# Patient Record
Sex: Female | Born: 1970 | Race: Black or African American | Hispanic: No | Marital: Single | Smoking: Never smoker
Health system: Southern US, Community
[De-identification: ages and names within clinical notes are randomized; demographics above are authoritative.]

## PROBLEM LIST (undated history)

## (undated) DIAGNOSIS — D649 Anemia, unspecified: Secondary | ICD-10-CM

## (undated) DIAGNOSIS — B20 Human immunodeficiency virus [HIV] disease: Secondary | ICD-10-CM

## (undated) DIAGNOSIS — Z21 Asymptomatic human immunodeficiency virus [HIV] infection status: Secondary | ICD-10-CM

## (undated) DIAGNOSIS — Z5189 Encounter for other specified aftercare: Secondary | ICD-10-CM

## (undated) DIAGNOSIS — IMO0001 Reserved for inherently not codable concepts without codable children: Secondary | ICD-10-CM

## (undated) HISTORY — DX: Encounter for other specified aftercare: Z51.89

## (undated) HISTORY — DX: Human immunodeficiency virus (HIV) disease: B20

## (undated) HISTORY — DX: Anemia, unspecified: D64.9

## (undated) HISTORY — DX: Asymptomatic human immunodeficiency virus (hiv) infection status: Z21

## (undated) HISTORY — PX: HERNIA REPAIR: SHX51

## (undated) HISTORY — DX: Reserved for inherently not codable concepts without codable children: IMO0001

---

## 2001-03-06 ENCOUNTER — Other Ambulatory Visit: Admission: RE | Admit: 2001-03-06 | Discharge: 2001-03-06 | Payer: Self-pay | Admitting: Gynecology

## 2001-07-06 ENCOUNTER — Encounter: Payer: Self-pay | Admitting: Infectious Disease

## 2001-07-06 ENCOUNTER — Ambulatory Visit (HOSPITAL_COMMUNITY): Admission: RE | Admit: 2001-07-06 | Discharge: 2001-07-06 | Payer: Self-pay | Admitting: Infectious Diseases

## 2001-07-06 ENCOUNTER — Encounter (INDEPENDENT_AMBULATORY_CARE_PROVIDER_SITE_OTHER): Payer: Self-pay | Admitting: *Deleted

## 2001-07-06 ENCOUNTER — Encounter: Admission: RE | Admit: 2001-07-06 | Discharge: 2001-07-06 | Payer: Self-pay | Admitting: Infectious Diseases

## 2001-07-06 LAB — CONVERTED CEMR LAB: CD4 Count: 190 microliters

## 2001-08-10 ENCOUNTER — Encounter: Admission: RE | Admit: 2001-08-10 | Discharge: 2001-08-10 | Payer: Self-pay | Admitting: Infectious Diseases

## 2001-08-10 ENCOUNTER — Ambulatory Visit (HOSPITAL_COMMUNITY): Admission: RE | Admit: 2001-08-10 | Discharge: 2001-08-10 | Payer: Self-pay | Admitting: Infectious Diseases

## 2001-08-18 ENCOUNTER — Encounter: Admission: RE | Admit: 2001-08-18 | Discharge: 2001-08-18 | Payer: Self-pay | Admitting: Infectious Diseases

## 2001-08-18 ENCOUNTER — Encounter: Payer: Self-pay | Admitting: *Deleted

## 2001-08-18 ENCOUNTER — Ambulatory Visit (HOSPITAL_COMMUNITY): Admission: RE | Admit: 2001-08-18 | Discharge: 2001-08-18 | Payer: Self-pay | Admitting: *Deleted

## 2001-08-24 ENCOUNTER — Encounter: Admission: RE | Admit: 2001-08-24 | Discharge: 2001-08-24 | Payer: Self-pay | Admitting: Infectious Diseases

## 2001-08-25 ENCOUNTER — Inpatient Hospital Stay (HOSPITAL_COMMUNITY): Admission: AD | Admit: 2001-08-25 | Discharge: 2001-08-25 | Payer: Self-pay | Admitting: *Deleted

## 2001-08-30 ENCOUNTER — Encounter: Payer: Self-pay | Admitting: *Deleted

## 2001-08-30 ENCOUNTER — Inpatient Hospital Stay (HOSPITAL_COMMUNITY): Admission: AD | Admit: 2001-08-30 | Discharge: 2001-08-30 | Payer: Self-pay | Admitting: *Deleted

## 2001-08-31 ENCOUNTER — Encounter: Payer: Self-pay | Admitting: *Deleted

## 2001-08-31 ENCOUNTER — Inpatient Hospital Stay (HOSPITAL_COMMUNITY): Admission: EM | Admit: 2001-08-31 | Discharge: 2001-09-03 | Payer: Self-pay | Admitting: Infectious Diseases

## 2001-08-31 ENCOUNTER — Encounter (INDEPENDENT_AMBULATORY_CARE_PROVIDER_SITE_OTHER): Payer: Self-pay | Admitting: Specialist

## 2001-10-19 ENCOUNTER — Ambulatory Visit (HOSPITAL_COMMUNITY): Admission: RE | Admit: 2001-10-19 | Discharge: 2001-10-19 | Payer: Self-pay | Admitting: Infectious Diseases

## 2001-10-19 ENCOUNTER — Encounter: Admission: RE | Admit: 2001-10-19 | Discharge: 2001-10-19 | Payer: Self-pay | Admitting: Infectious Diseases

## 2001-11-23 ENCOUNTER — Ambulatory Visit (HOSPITAL_COMMUNITY): Admission: RE | Admit: 2001-11-23 | Discharge: 2001-11-23 | Payer: Self-pay | Admitting: Infectious Diseases

## 2001-11-23 ENCOUNTER — Encounter: Admission: RE | Admit: 2001-11-23 | Discharge: 2001-11-23 | Payer: Self-pay | Admitting: Infectious Diseases

## 2001-12-14 ENCOUNTER — Encounter: Admission: RE | Admit: 2001-12-14 | Discharge: 2001-12-14 | Payer: Self-pay | Admitting: Infectious Diseases

## 2002-02-15 ENCOUNTER — Encounter: Admission: RE | Admit: 2002-02-15 | Discharge: 2002-02-15 | Payer: Self-pay | Admitting: Infectious Diseases

## 2002-02-15 ENCOUNTER — Ambulatory Visit (HOSPITAL_COMMUNITY): Admission: RE | Admit: 2002-02-15 | Discharge: 2002-02-15 | Payer: Self-pay | Admitting: Infectious Diseases

## 2002-04-05 ENCOUNTER — Encounter: Payer: Self-pay | Admitting: Infectious Disease

## 2002-04-05 ENCOUNTER — Encounter: Admission: RE | Admit: 2002-04-05 | Discharge: 2002-04-05 | Payer: Self-pay | Admitting: Infectious Diseases

## 2002-07-05 ENCOUNTER — Encounter: Admission: RE | Admit: 2002-07-05 | Discharge: 2002-07-05 | Payer: Self-pay | Admitting: Infectious Diseases

## 2002-07-20 ENCOUNTER — Encounter: Admission: RE | Admit: 2002-07-20 | Discharge: 2002-07-20 | Payer: Self-pay | Admitting: Obstetrics and Gynecology

## 2002-08-09 ENCOUNTER — Encounter (INDEPENDENT_AMBULATORY_CARE_PROVIDER_SITE_OTHER): Payer: Self-pay | Admitting: Infectious Diseases

## 2002-08-09 ENCOUNTER — Encounter: Admission: RE | Admit: 2002-08-09 | Discharge: 2002-08-09 | Payer: Self-pay | Admitting: Infectious Diseases

## 2002-08-23 ENCOUNTER — Encounter: Admission: RE | Admit: 2002-08-23 | Discharge: 2002-08-23 | Payer: Self-pay | Admitting: Infectious Diseases

## 2002-10-04 ENCOUNTER — Encounter: Admission: RE | Admit: 2002-10-04 | Discharge: 2002-10-04 | Payer: Self-pay | Admitting: Infectious Diseases

## 2002-10-04 ENCOUNTER — Encounter (INDEPENDENT_AMBULATORY_CARE_PROVIDER_SITE_OTHER): Payer: Self-pay | Admitting: Infectious Diseases

## 2002-10-18 ENCOUNTER — Encounter: Admission: RE | Admit: 2002-10-18 | Discharge: 2002-10-18 | Payer: Self-pay | Admitting: Infectious Diseases

## 2002-11-10 ENCOUNTER — Encounter (HOSPITAL_BASED_OUTPATIENT_CLINIC_OR_DEPARTMENT_OTHER): Payer: Self-pay | Admitting: General Surgery

## 2002-11-12 ENCOUNTER — Encounter (INDEPENDENT_AMBULATORY_CARE_PROVIDER_SITE_OTHER): Payer: Self-pay

## 2002-11-12 ENCOUNTER — Inpatient Hospital Stay (HOSPITAL_COMMUNITY): Admission: RE | Admit: 2002-11-12 | Discharge: 2002-11-14 | Payer: Self-pay | Admitting: General Surgery

## 2003-02-01 ENCOUNTER — Encounter (INDEPENDENT_AMBULATORY_CARE_PROVIDER_SITE_OTHER): Payer: Self-pay | Admitting: Infectious Diseases

## 2003-02-01 ENCOUNTER — Encounter: Admission: RE | Admit: 2003-02-01 | Discharge: 2003-02-01 | Payer: Self-pay | Admitting: Infectious Diseases

## 2003-02-01 ENCOUNTER — Ambulatory Visit (HOSPITAL_COMMUNITY): Admission: RE | Admit: 2003-02-01 | Discharge: 2003-02-01 | Payer: Self-pay | Admitting: Infectious Diseases

## 2003-03-15 ENCOUNTER — Encounter: Admission: RE | Admit: 2003-03-15 | Discharge: 2003-03-15 | Payer: Self-pay | Admitting: Obstetrics and Gynecology

## 2003-03-22 ENCOUNTER — Encounter: Admission: RE | Admit: 2003-03-22 | Discharge: 2003-03-22 | Payer: Self-pay | Admitting: Obstetrics and Gynecology

## 2003-03-22 ENCOUNTER — Encounter (INDEPENDENT_AMBULATORY_CARE_PROVIDER_SITE_OTHER): Payer: Self-pay | Admitting: *Deleted

## 2004-01-11 ENCOUNTER — Ambulatory Visit (HOSPITAL_COMMUNITY): Admission: RE | Admit: 2004-01-11 | Discharge: 2004-01-11 | Payer: Self-pay | Admitting: Infectious Diseases

## 2004-01-11 ENCOUNTER — Ambulatory Visit: Payer: Self-pay | Admitting: Infectious Diseases

## 2004-01-30 ENCOUNTER — Ambulatory Visit: Payer: Self-pay | Admitting: Infectious Diseases

## 2004-02-12 ENCOUNTER — Emergency Department (HOSPITAL_COMMUNITY): Admission: EM | Admit: 2004-02-12 | Discharge: 2004-02-13 | Payer: Self-pay | Admitting: Emergency Medicine

## 2004-02-12 ENCOUNTER — Encounter: Payer: Self-pay | Admitting: Family Medicine

## 2004-03-26 ENCOUNTER — Encounter (INDEPENDENT_AMBULATORY_CARE_PROVIDER_SITE_OTHER): Payer: Self-pay | Admitting: Specialist

## 2004-03-26 ENCOUNTER — Ambulatory Visit (HOSPITAL_COMMUNITY): Admission: RE | Admit: 2004-03-26 | Discharge: 2004-03-27 | Payer: Self-pay | Admitting: General Surgery

## 2004-04-04 ENCOUNTER — Inpatient Hospital Stay (HOSPITAL_COMMUNITY): Admission: RE | Admit: 2004-04-04 | Discharge: 2004-04-10 | Payer: Self-pay | Admitting: General Surgery

## 2004-04-04 ENCOUNTER — Ambulatory Visit: Payer: Self-pay | Admitting: Internal Medicine

## 2004-07-30 ENCOUNTER — Encounter (INDEPENDENT_AMBULATORY_CARE_PROVIDER_SITE_OTHER): Payer: Self-pay | Admitting: *Deleted

## 2004-07-30 ENCOUNTER — Ambulatory Visit: Payer: Self-pay | Admitting: Infectious Diseases

## 2004-07-30 ENCOUNTER — Encounter: Payer: Self-pay | Admitting: Infectious Disease

## 2004-07-30 ENCOUNTER — Ambulatory Visit (HOSPITAL_COMMUNITY): Admission: RE | Admit: 2004-07-30 | Discharge: 2004-07-30 | Payer: Self-pay | Admitting: Infectious Diseases

## 2004-07-30 LAB — CONVERTED CEMR LAB
CD4 Count: 190 microliters
HIV 1 RNA Quant: 36200 copies/mL

## 2005-01-28 ENCOUNTER — Encounter: Payer: Self-pay | Admitting: Infectious Disease

## 2005-01-28 ENCOUNTER — Encounter (INDEPENDENT_AMBULATORY_CARE_PROVIDER_SITE_OTHER): Payer: Self-pay | Admitting: *Deleted

## 2005-01-28 ENCOUNTER — Ambulatory Visit: Payer: Self-pay | Admitting: Infectious Diseases

## 2005-01-28 ENCOUNTER — Ambulatory Visit (HOSPITAL_COMMUNITY): Admission: RE | Admit: 2005-01-28 | Discharge: 2005-01-28 | Payer: Self-pay | Admitting: Infectious Diseases

## 2005-02-11 ENCOUNTER — Encounter: Payer: Self-pay | Admitting: Infectious Disease

## 2005-02-11 ENCOUNTER — Ambulatory Visit: Payer: Self-pay | Admitting: Infectious Diseases

## 2005-11-21 IMAGING — US US TRANSVAGINAL NON-OB
1 series · 14 of 25 positions shown · non-contrast
Comparison: none

CLINICAL DATA: Right lower quadrant pain. 
 PELVIC SONOGRAM TRANSABDOMINAL AND TRANSVAGINAL 
 Uterine size within normal limits.  No lesions of the myometrium.  Endometrium is homogeneous and measures about 8 mm. 
 Right ovary 4.4 x 2.5 x 2.2 cm.  There are multiple small follicles and a 2 cm cyst or follicle.  The left ovary shows the same findings as the right.  No paraovarian fluid. There is a small amount of free fluid in the pelvis that I would regard as physiological. 
 IMPRESSION
 1.  Normal uterus.
 2.  Normal ovaries, each showing multiple follicles and each showing a 2 cm cyst or dominant follicle.

[Series 1: us transvaginal non-ob · 0.31mm/px · 14 of 52 slices shown]
[im 1/52]
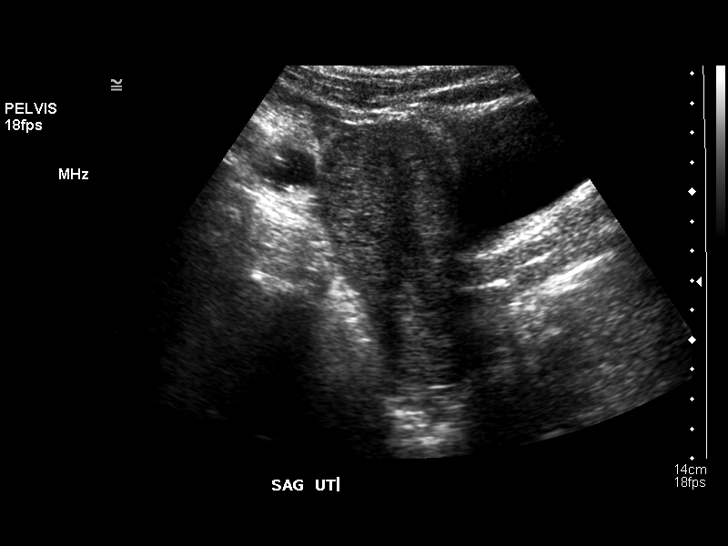
[im 5/52]
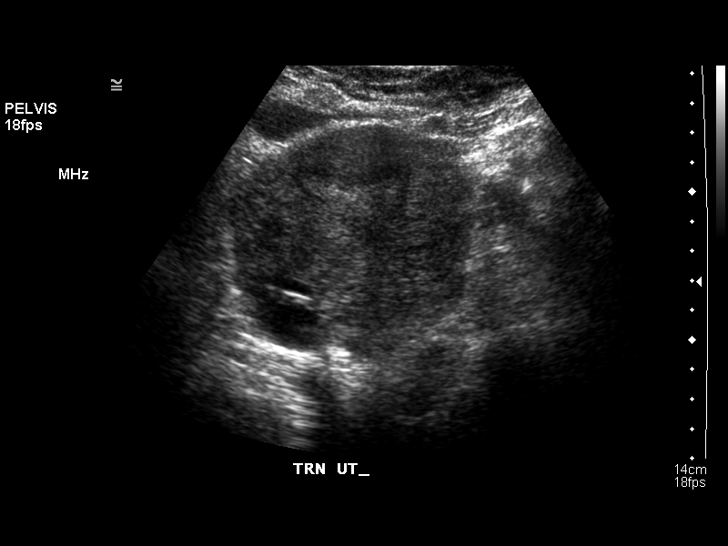
[im 9/52]
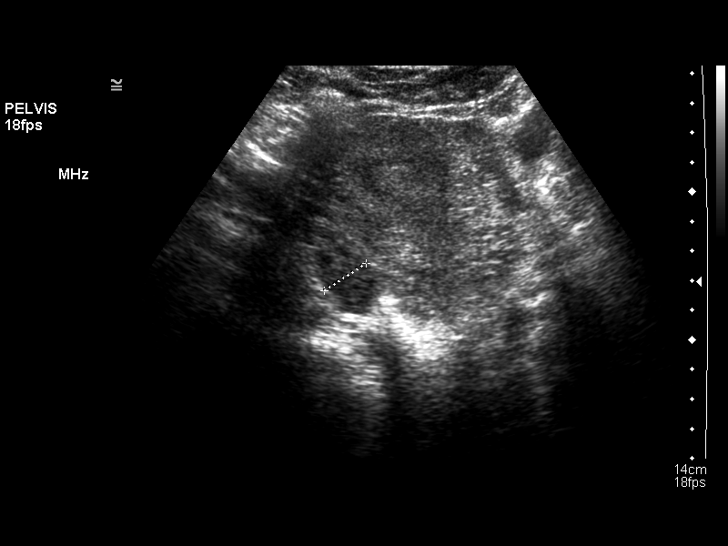
[im 13/52]
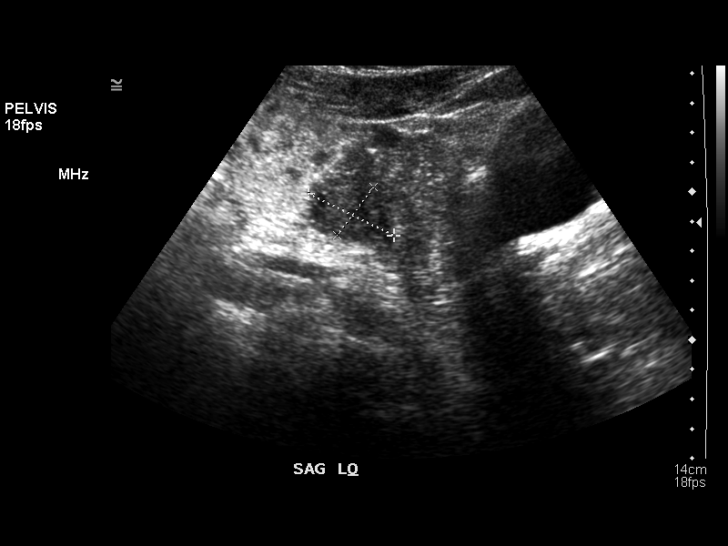
[im 18/52]
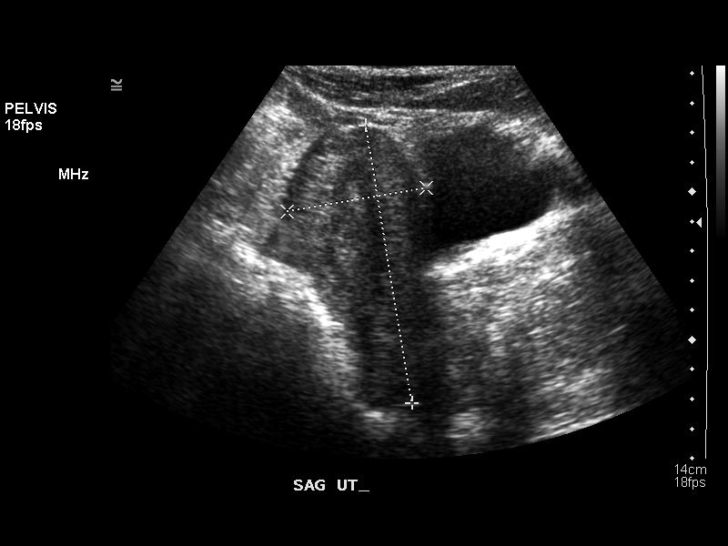
[im 20/52]
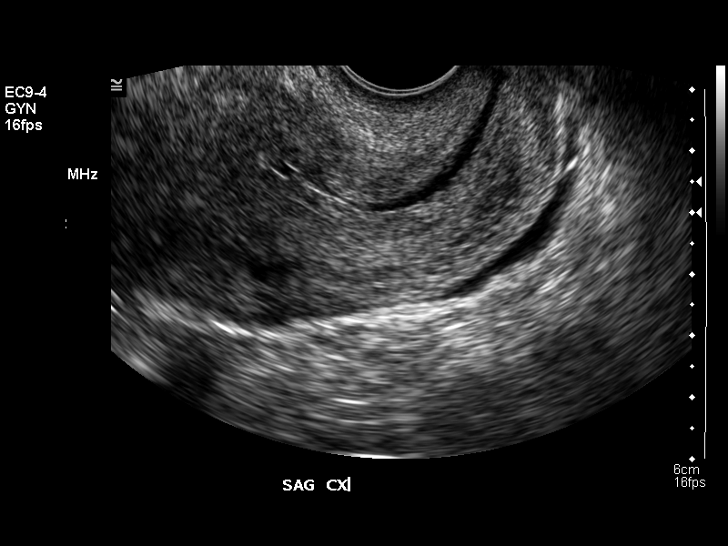
[im 24/52]
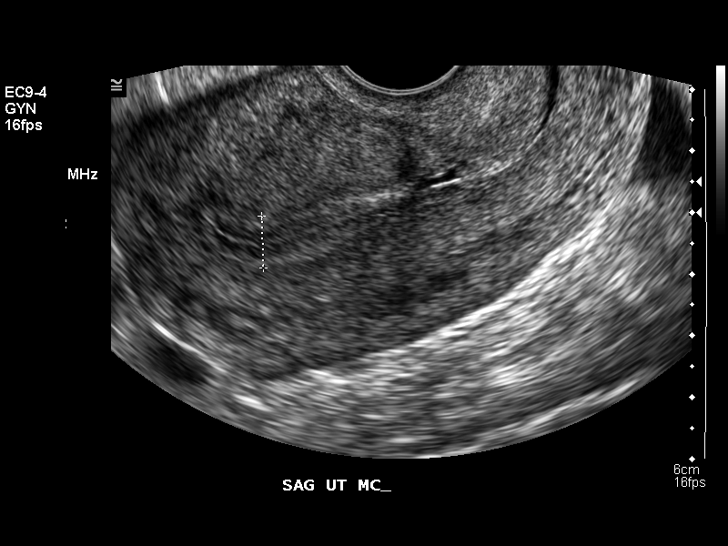
[im 28/52]
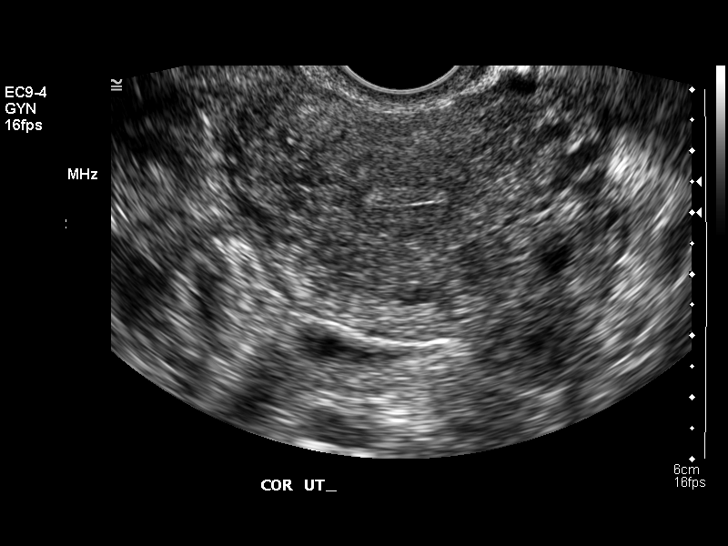
[im 32/52]
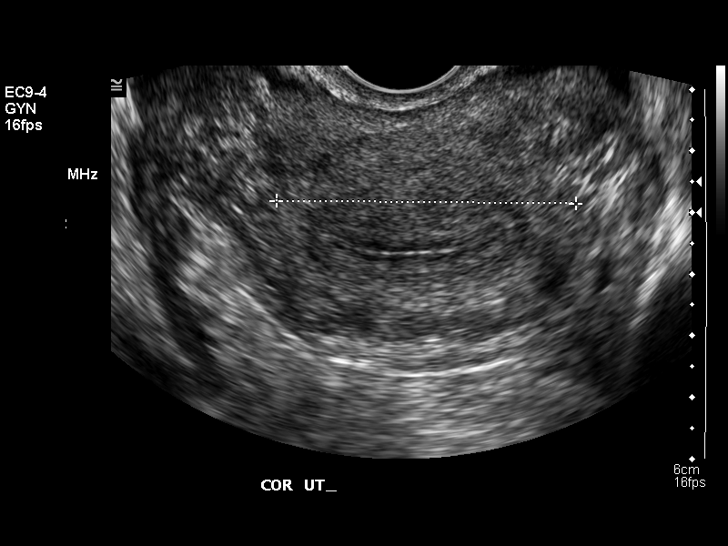
[im 35/52]
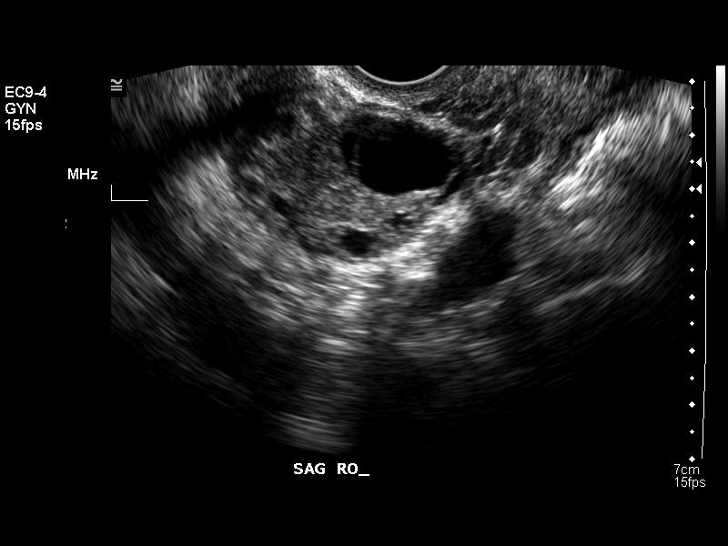
[im 39/52]
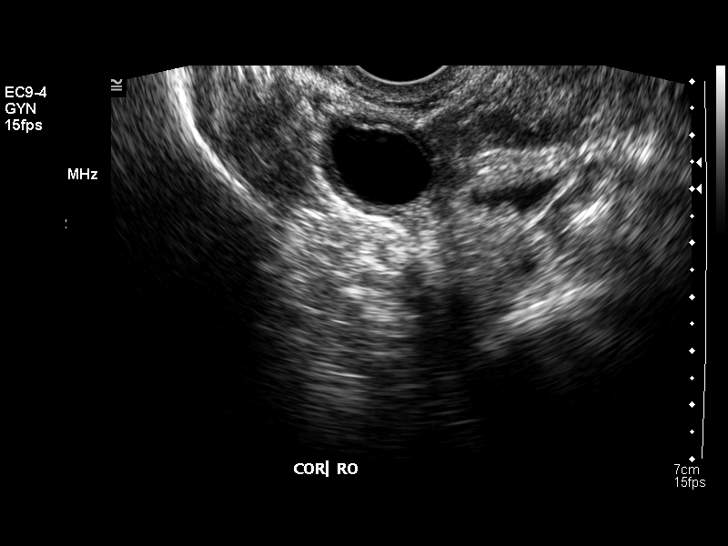
[im 43/52]
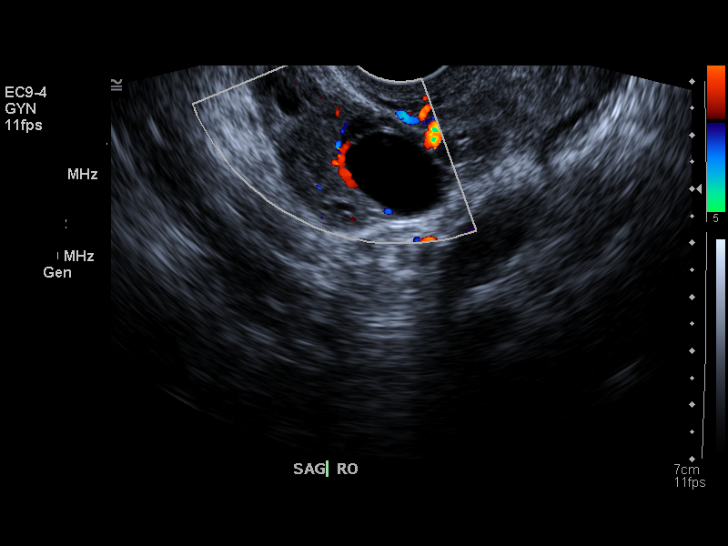
[im 47/52]
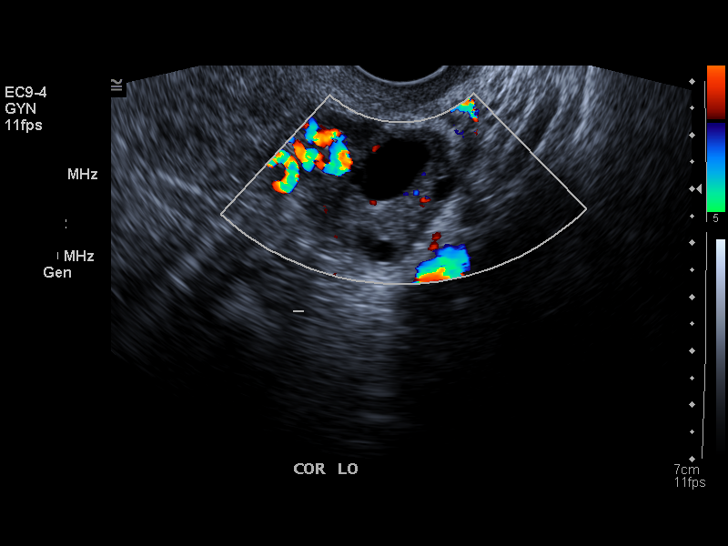
[im 52/52]
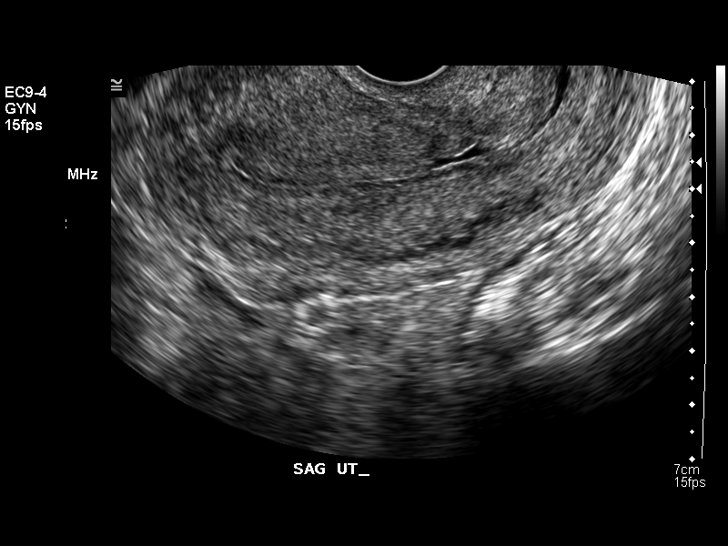

[14 of 25 positions shown; findings below may reference images not displayed]

## 2006-01-13 IMAGING — CR DG CHEST 1V PORT
1 series · 1 of 1 positions shown · non-contrast
Comparison: None.

CLINICAL DATA: Abdominal abscess.  Evaluate central line placement. 
 CHEST PORTABLE, ONE VIEW 04/05/04:

[view not recorded]
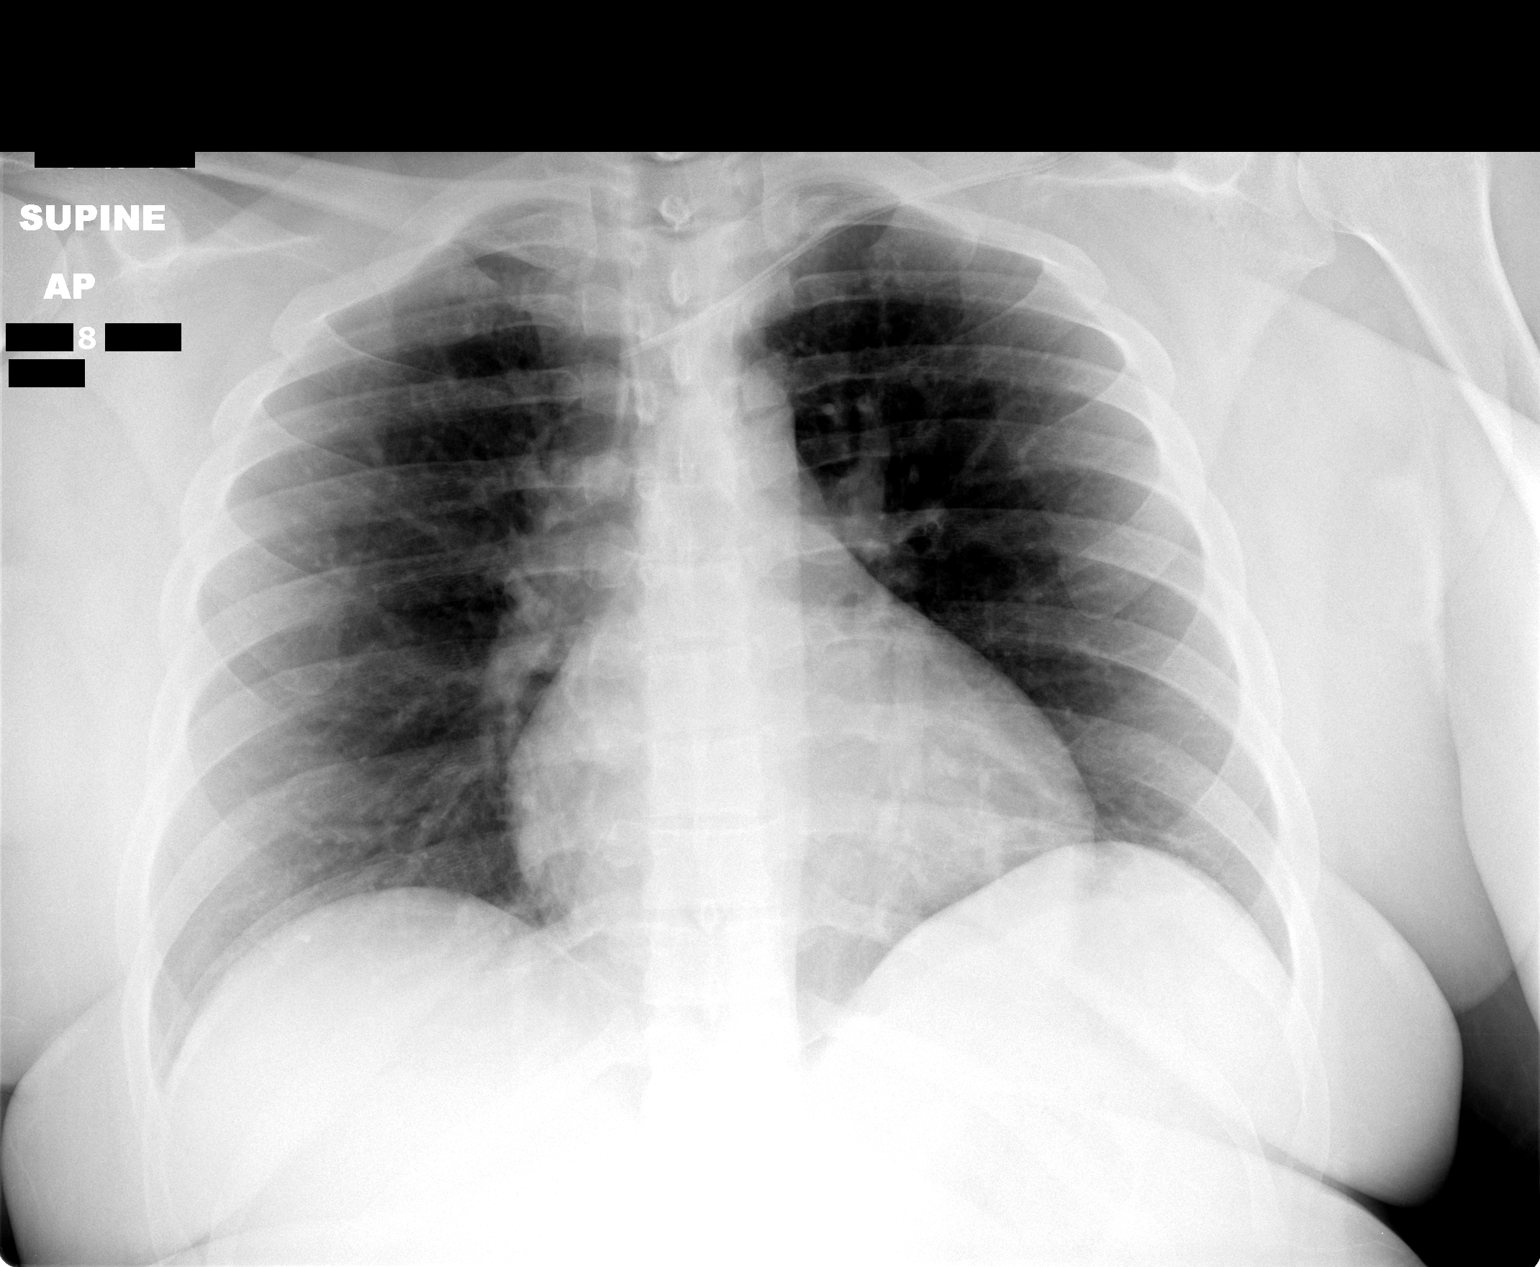

[1 of 1 positions shown; findings below may reference images not displayed]

FINDINGS: Frontal view of the chest at 4384 hours shows left subclavian central line with the tip positioned near the expected confluence of the brachiocephalic veins.  The lungs are clear without evidence for pneumothorax.  Heart size is upper limits of normal and likely accentuated by the low lung volumes.
IMPRESSION: Left subclavian central line tip near the confluence of the brachiocephalic veins.

## 2006-06-23 ENCOUNTER — Encounter (INDEPENDENT_AMBULATORY_CARE_PROVIDER_SITE_OTHER): Payer: Self-pay | Admitting: *Deleted

## 2006-06-23 LAB — CONVERTED CEMR LAB

## 2006-07-06 ENCOUNTER — Encounter (INDEPENDENT_AMBULATORY_CARE_PROVIDER_SITE_OTHER): Payer: Self-pay | Admitting: *Deleted

## 2006-10-14 DIAGNOSIS — B20 Human immunodeficiency virus [HIV] disease: Secondary | ICD-10-CM | POA: Insufficient documentation

## 2006-10-14 DIAGNOSIS — Z21 Asymptomatic human immunodeficiency virus [HIV] infection status: Secondary | ICD-10-CM | POA: Insufficient documentation

## 2006-12-08 ENCOUNTER — Ambulatory Visit: Payer: Self-pay | Admitting: Infectious Disease

## 2006-12-08 ENCOUNTER — Encounter: Admission: RE | Admit: 2006-12-08 | Discharge: 2006-12-08 | Payer: Self-pay | Admitting: Internal Medicine

## 2006-12-08 ENCOUNTER — Encounter (INDEPENDENT_AMBULATORY_CARE_PROVIDER_SITE_OTHER): Payer: Self-pay | Admitting: *Deleted

## 2006-12-08 LAB — CONVERTED CEMR LAB
AST: 30 units/L (ref 0–37)
Alkaline Phosphatase: 44 units/L (ref 39–117)
BUN: 10 mg/dL (ref 6–23)
Basophils Relative: 0 % (ref 0–1)
Bilirubin Urine: NEGATIVE
Calcium: 9.3 mg/dL (ref 8.4–10.5)
Creatinine, Ser: 0.97 mg/dL (ref 0.40–1.20)
Eosinophils Absolute: 0.1 10*3/uL (ref 0.0–0.7)
Glucose, Bld: 96 mg/dL (ref 70–99)
HCT: 38.9 % (ref 36.0–46.0)
HIV 1 RNA Quant: <50 copies/mL (ref <50)
HIV-1 RNA Quant, Log: <1.7 (ref <1.70)
Hemoglobin: 11.8 g/dL — ABNORMAL LOW (ref 12.0–15.0)
Leukocytes, UA: NEGATIVE
Lymphs Abs: 1.6 10*3/uL (ref 0.7–3.3)
MCHC: 30.3 g/dL (ref 30.0–36.0)
MCV: 81.4 fL (ref 78.0–100.0)
Monocytes Absolute: 0.3 10*3/uL (ref 0.2–0.7)
Monocytes Relative: 5 % (ref 3–11)
Protein, ur: NEGATIVE mg/dL
RBC: 4.78 M/uL (ref 3.87–5.11)
Specific Gravity, Urine: 1.021 (ref 1.005–1.03)
Urine Glucose: NEGATIVE mg/dL
Urobilinogen, UA: 0.2 (ref 0.0–1.0)

## 2006-12-17 ENCOUNTER — Ambulatory Visit: Payer: Self-pay | Admitting: Infectious Disease

## 2007-01-05 ENCOUNTER — Telehealth (INDEPENDENT_AMBULATORY_CARE_PROVIDER_SITE_OTHER): Payer: Self-pay | Admitting: *Deleted

## 2007-01-05 ENCOUNTER — Encounter (INDEPENDENT_AMBULATORY_CARE_PROVIDER_SITE_OTHER): Payer: Self-pay | Admitting: *Deleted

## 2007-01-14 ENCOUNTER — Encounter (HOSPITAL_BASED_OUTPATIENT_CLINIC_OR_DEPARTMENT_OTHER): Payer: Self-pay | Admitting: General Surgery

## 2007-01-15 ENCOUNTER — Inpatient Hospital Stay (HOSPITAL_COMMUNITY): Admission: RE | Admit: 2007-01-15 | Discharge: 2007-01-16 | Payer: Self-pay | Admitting: General Surgery

## 2007-04-29 ENCOUNTER — Encounter (INDEPENDENT_AMBULATORY_CARE_PROVIDER_SITE_OTHER): Payer: Self-pay | Admitting: *Deleted

## 2007-05-12 ENCOUNTER — Telehealth: Payer: Self-pay | Admitting: Infectious Disease

## 2007-07-10 ENCOUNTER — Encounter (INDEPENDENT_AMBULATORY_CARE_PROVIDER_SITE_OTHER): Payer: Self-pay | Admitting: *Deleted

## 2008-02-29 ENCOUNTER — Encounter (INDEPENDENT_AMBULATORY_CARE_PROVIDER_SITE_OTHER): Payer: Self-pay | Admitting: *Deleted

## 2008-05-06 ENCOUNTER — Ambulatory Visit: Payer: Self-pay | Admitting: Infectious Disease

## 2008-05-06 LAB — CONVERTED CEMR LAB
ALT: 8 units/L (ref 0–35)
AST: 16 units/L (ref 0–37)
Alkaline Phosphatase: 41 units/L (ref 39–117)
Basophils Absolute: 0 10*3/uL (ref 0.0–0.1)
Basophils Relative: 0 % (ref 0–1)
Cholesterol: 161 mg/dL (ref 0–200)
Creatinine, Ser: 0.92 mg/dL (ref 0.40–1.20)
Eosinophils Absolute: 0.1 10*3/uL (ref 0.0–0.7)
Eosinophils Relative: 2 % (ref 0–5)
GC Probe Amp, Urine: NEGATIVE
HCT: 35.7 % — ABNORMAL LOW (ref 36.0–46.0)
HIV 1 RNA Quant: 83 copies/mL — ABNORMAL HIGH (ref <48)
Hemoglobin: 10.8 g/dL — ABNORMAL LOW (ref 12.0–15.0)
LDL Cholesterol: 80 mg/dL (ref 0–99)
Leukocytes, UA: NEGATIVE
Lymphocytes Relative: 32 % (ref 12–46)
MCHC: 30.3 g/dL (ref 30.0–36.0)
Monocytes Absolute: 0.2 10*3/uL (ref 0.1–1.0)
Platelets: 188 10*3/uL (ref 150–400)
Protein, ur: NEGATIVE mg/dL
RDW: 15.8 % — ABNORMAL HIGH (ref 11.5–15.5)
Sodium: 140 meq/L (ref 135–145)
Total Bilirubin: 0.7 mg/dL (ref 0.3–1.2)
Total CHOL/HDL Ratio: 3.3
Total Protein: 6.7 g/dL (ref 6.0–8.3)
Urine Glucose: NEGATIVE mg/dL
Urobilinogen, UA: 0.2 (ref 0.0–1.0)
VLDL: 32 mg/dL (ref 0–40)

## 2008-05-19 ENCOUNTER — Ambulatory Visit: Payer: Self-pay | Admitting: Infectious Disease

## 2008-08-01 ENCOUNTER — Encounter (INDEPENDENT_AMBULATORY_CARE_PROVIDER_SITE_OTHER): Payer: Self-pay | Admitting: *Deleted

## 2008-10-22 IMAGING — CR DG CHEST 2V
2 series · 2 of 2 positions shown · non-contrast
Comparison: 04/05/04.

CLINICAL DATA: 35 year old; pre-op for ventral hernia repair.
 CHEST - 2 VIEW - 01/13/07:

[view not recorded (1 of 2)]
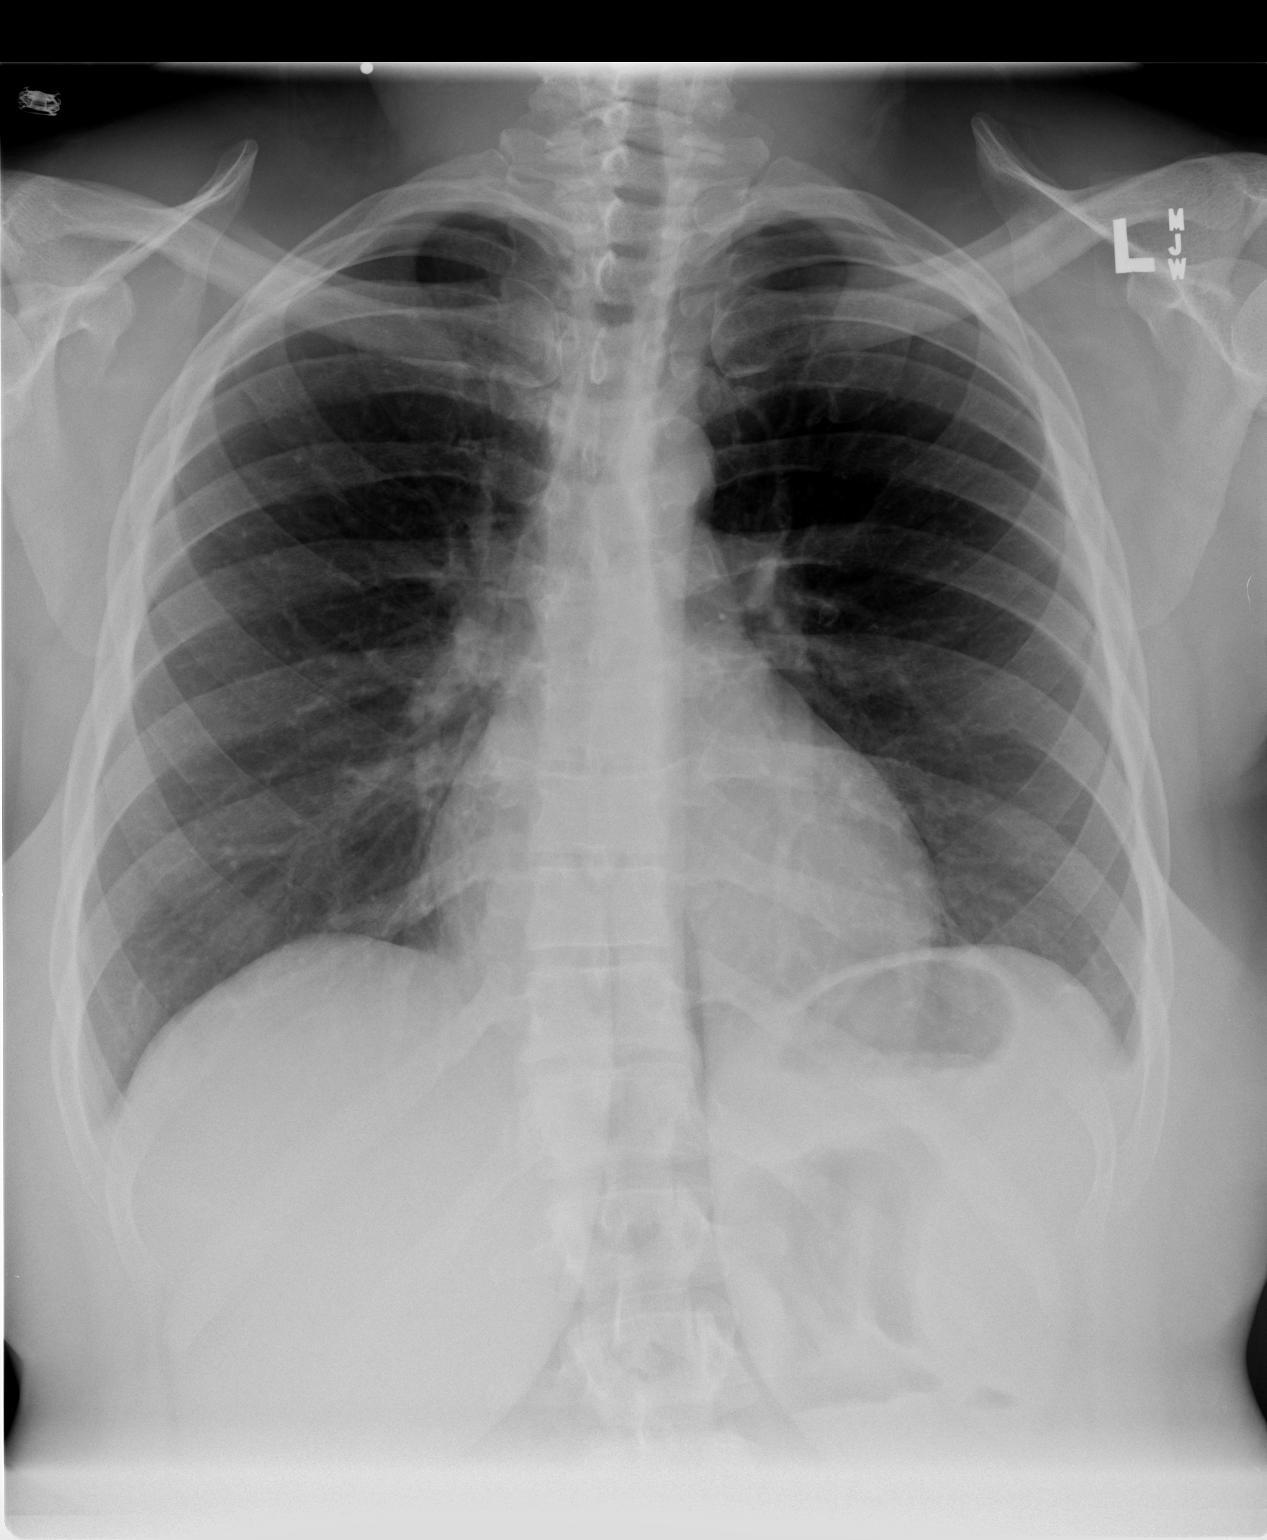

[view not recorded (2 of 2)]
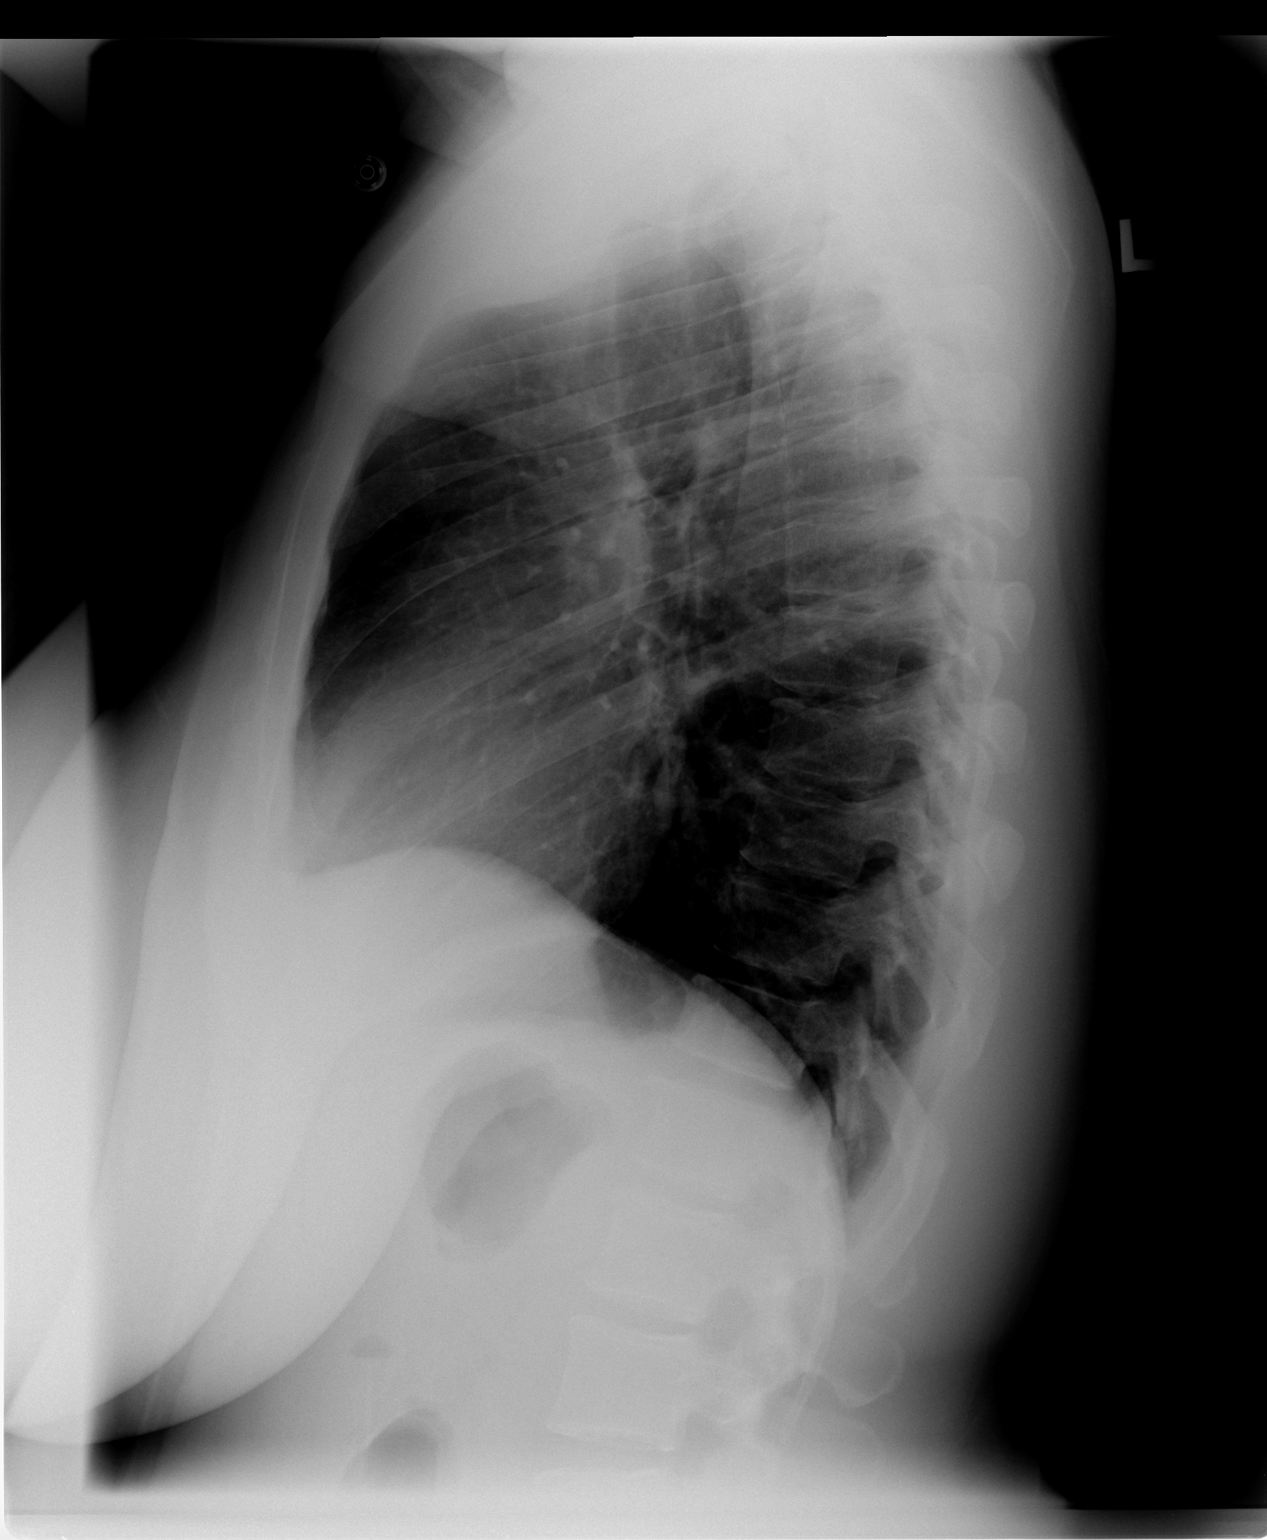

[2 of 2 positions shown; findings below may reference images not displayed]

FINDINGS: The heart size and mediastinal contours are within normal limits.  Both lungs are clear.  The visualized skeletal structures are within normal limits.
IMPRESSION: No active cardiopulmonary disease.

## 2009-05-31 ENCOUNTER — Encounter (INDEPENDENT_AMBULATORY_CARE_PROVIDER_SITE_OTHER): Payer: Self-pay | Admitting: *Deleted

## 2009-07-04 ENCOUNTER — Encounter (INDEPENDENT_AMBULATORY_CARE_PROVIDER_SITE_OTHER): Payer: Self-pay | Admitting: *Deleted

## 2009-07-14 ENCOUNTER — Encounter (INDEPENDENT_AMBULATORY_CARE_PROVIDER_SITE_OTHER): Payer: Self-pay | Admitting: *Deleted

## 2009-07-24 ENCOUNTER — Ambulatory Visit: Payer: Self-pay | Admitting: Infectious Disease

## 2009-07-24 LAB — CONVERTED CEMR LAB
AST: 17 units/L (ref 0–37)
Albumin: 4.1 g/dL (ref 3.5–5.2)
Alkaline Phosphatase: 44 units/L (ref 39–117)
Basophils Absolute: 0 10*3/uL (ref 0.0–0.1)
Basophils Relative: 0 % (ref 0–1)
Calcium: 9 mg/dL (ref 8.4–10.5)
Chloride: 109 meq/L (ref 96–112)
Cholesterol: 155 mg/dL (ref 0–200)
Glucose, Bld: 70 mg/dL (ref 70–99)
HIV 1 RNA Quant: 67 copies/mL — ABNORMAL HIGH (ref <48)
HIV-1 RNA Quant, Log: 1.83 — ABNORMAL HIGH (ref <1.68)
MCHC: 29.5 g/dL — ABNORMAL LOW (ref 30.0–36.0)
Neutro Abs: 4.8 10*3/uL (ref 1.7–7.7)
Neutrophils Relative %: 71 % (ref 43–77)
Potassium: 3.8 meq/L (ref 3.5–5.3)
RDW: 18.7 % — ABNORMAL HIGH (ref 11.5–15.5)
Sodium: 140 meq/L (ref 135–145)
Total Protein: 7.8 g/dL (ref 6.0–8.3)

## 2009-08-21 ENCOUNTER — Ambulatory Visit: Payer: Self-pay | Admitting: Infectious Disease

## 2009-08-21 DIAGNOSIS — Z862 Personal history of diseases of the blood and blood-forming organs and certain disorders involving the immune mechanism: Secondary | ICD-10-CM | POA: Insufficient documentation

## 2009-08-21 DIAGNOSIS — K069 Disorder of gingiva and edentulous alveolar ridge, unspecified: Secondary | ICD-10-CM | POA: Insufficient documentation

## 2009-08-21 DIAGNOSIS — K056 Periodontal disease, unspecified: Secondary | ICD-10-CM | POA: Insufficient documentation

## 2009-08-21 DIAGNOSIS — N920 Excessive and frequent menstruation with regular cycle: Secondary | ICD-10-CM | POA: Insufficient documentation

## 2009-08-24 LAB — CONVERTED CEMR LAB: GC Probe Amp, Urine: NEGATIVE

## 2009-09-21 ENCOUNTER — Ambulatory Visit: Payer: Self-pay | Admitting: Infectious Disease

## 2009-10-09 ENCOUNTER — Telehealth: Payer: Self-pay | Admitting: Infectious Disease

## 2010-03-21 ENCOUNTER — Encounter (INDEPENDENT_AMBULATORY_CARE_PROVIDER_SITE_OTHER): Payer: Self-pay | Admitting: *Deleted

## 2010-05-29 NOTE — Miscellaneous (Signed)
Summary: RW Update  Clinical Lists Changes  Observations: Added new observation of HIV STATUS: CDC-defined AIDS (05/31/2009 16:57)

## 2010-05-29 NOTE — Assessment & Plan Note (Signed)
Summary: resfrom 4/18   Visit Type:  Follow-up Primary Provider:  Paulette Blanch Dam MD  CC:  f/u .  History of Present Illness: 40 year old African American lady with HIV extremely controlled on her regimen of kaletra ii tabs two times a day and truvada once per day. Her most recent HIV VL  in the 60s and and Cd4 in the 300s.  She admits to occasionally missing a dose of kaletra every few months.She continues to have troubles with heavy menstrual bleeing off of her hormoen patch though she says she is going back ion it now. She also is not especially compliant with her iron due to gi upset and constipation it causes. We discussed going to once daily kaletra 4 tablets with truvada vs prezista, norvir truvada to improve compliance and reduce GI side effects. She did not want to make that change today.  Problems Prior to Update: 1)  Preventive Health Care  (ICD-V70.0) 2)  Ventral Hernia, Repaired  (ICD-553.20) 3)  HIV Disease  (ICD-042)  Medications Prior to Update: 1)  Truvada 200-300 Mg  Tabs (Emtricitabine-Tenofovir) .... Take 1 Tablet By Mouth Once A Day 2)  Kaletra 200-50 Mg  Tabs (Lopinavir-Ritonavir) .... Take 2 Tablets By Mouth Two Times A Day  Current Medications (verified): 1)  Truvada 200-300 Mg  Tabs (Emtricitabine-Tenofovir) .... Take 1 Tablet By Mouth Once A Day 2)  Kaletra 200-50 Mg  Tabs (Lopinavir-Ritonavir) .... Take 2 Tablets By Mouth Two Times A Day  Allergies (verified): No Known Drug Allergies   Preventive Screening-Counseling & Management  Alcohol-Tobacco     Smoking Status: never     Passive Smoke Exposure: no  Caffeine-Diet-Exercise     Caffeine use/day: 1     Does Patient Exercise: yes     Type of exercise: going to a gym with a trainer     Times/week: 6   Current Allergies (reviewed today): No known allergies  Past History:  Past Medical History: HIV disease Ventral hernia, repaired 2005 Dental caries tooth extraction Menorrhagia Irone  deficiecny anemia  Past Surgical History: Reviewed history from 12/17/2006 and no changes required. ventral hernai repaired in 2005  Review of Systems       The patient complains of abnormal bleeding.  The patient denies anorexia, fever, weight loss, weight gain, vision loss, decreased hearing, hoarseness, chest pain, syncope, dyspnea on exertion, peripheral edema, prolonged cough, headaches, hemoptysis, abdominal pain, melena, hematochezia, severe indigestion/heartburn, hematuria, incontinence, genital sores, muscle weakness, suspicious skin lesions, transient blindness, difficulty walking, depression, unusual weight change, and enlarged lymph nodes.    Vital Signs:  Patient profile:   40 year old female Height:      63 inches (160.02 cm) Weight:      225.50 pounds (102.50 kg) BMI:     40.09 Temp:     98.0 degrees F (36.67 degrees C) oral Pulse rate:   68 / minute BP sitting:   131 / 81  (left arm)  Vitals Entered By: Starleen Arms CMA (August 21, 2009 9:07 AM) CC: f/u  Is Patient Diabetic? No Pain Assessment Patient in pain? no      Nutritional Status BMI of > 30 = obese Nutritional Status Detail nl  Does patient need assistance? Functional Status Self care Ambulation Normal   Physical Exam  General:  alert, well-developed, and well-nourished.   Head:  normocephalic, atraumatic  Eyes:  vision grossly intact, pupils equal, pupils round, and pupils reactive to light.   Ears:  no  external deformities.   Nose:  no external deformity.   Mouth:  no gingival abnormalities, pharynx pink and moist, no erythema, and no exudates.   Neck:  supple.  full ROM.   Lungs:  normal respiratory effort, no intercostal retractions, no accessory muscle use, normal breath sounds, no crackles, and no wheezes.   Heart:  normal rate, regular rhythm, no murmur, no gallop, and no rub.   Abdomen:  soft, no masses, and abdominal scar(s). tenderness along hernia scar   Msk:  no joint tenderness.   no joint deformities.   Extremities:  No clubbing, cyanosis, edema, or deformity noted with normal full range of motion of all joints.   Neurologic:  alert & oriented X3.  gait normal.   Skin:  turgor normal, color normal, and no rashes.   Psych:  Oriented X3.  normally interactive, good eye contact, and not anxious appearing.          Medication Adherence: 08/21/2009   Adherence to medications reviewed with patient. Counseling to provide adequate adherence provided   Prevention For Positives: 08/21/2009   Safe sex practices discussed with patient. Condoms offered.   Education Materials Provided: 08/21/2009 Safe sex practices discussed with patient. Condoms offered.                          Impression & Recommendations:  Problem # 1:  HIV DISEASE (ICD-042)  Excellent control. She can try once daily kaletra 4 tablets with once daily truvada. Once daily prezistanorvir truvada is another optino. Fu appt in a year. REvacciante with hep B due to negatative Hep B sab Diagnostics Reviewed:  HIV: CDC-defined AIDS (05/31/2009)   CD4: 380 (07/25/2009)   WBC: 6.7 (07/24/2009)   Hgb: 9.0 (07/24/2009)   HCT: 30.5 (07/24/2009)   Platelets: 260 (07/24/2009) HIV-1 RNA: 67 (07/24/2009)   HBSAg: NO (06/23/2006)  Orders: Est. Patient Level IV (54098)  Problem # 2:  EXCESSIVE MENSTRUAL BLEEDING (ICD-626.2) Assessment: Comment Only  has plug in with [facility] who are also dong her pap smears. SIs going back on contraceptive patch  Orders: Est. Patient Level IV (11914)  Problem # 3:  IRON DEFICIENCY ANEMIA, HX OF (ICD-V12.3)  not especailly compliant with iron. Hopefully will be compliant with her contraceptive to attenuate cause of this anemia  Orders: Est. Patient Level IV (78295)  Problem # 4:  PERIODONTAL DISEASE (ICD-523.9)  sp removal of tooth last yr with dental plug in  Orders: Est. Patient Level IV (62130)  Other Orders: T-GC Probe, urine ([id number]) T-Chlamydia  Probe,  urine (95284-13244) Hepatitis B Vaccine >36yrs (01027) Admin 1st Vaccine (25366) Future Orders: T-CD4SP ([facility]) (CD4SP) ... 08/16/2010 T-HIV Viral Load [id number]) ... 08/16/2010 T-CBC w/Diff ([id number]) ... 08/16/2010 T-Comprehensive Metabolic Panel [phone]) ... 08/16/2010 T-RPR (Syphilis) [id number]) ... 08/16/2010 T-Lipid Profile [id number]) ... 08/16/2010  Patient Instructions: 1)  return for Hepatitis  B shots in one month and 6 months 2)  rtc in one year for labs and fu appt    Immunizations Administered:  Hepatitis B Vaccine # 4:    Vaccine Type: HepB Adult    Site: left deltoid    Mfr: Merck    Dose: 0.5 ml    Route: IM    Given by: Starleen Arms CMA    Exp. Date: 02/18/2012    Lot #: [id number]    VIS given: 11/13/05 version given August 21, 2009.

## 2010-05-29 NOTE — Miscellaneous (Signed)
Summary: clinical update/ryan white [place]ADAP app completed  Clinical Lists Changes  Observations: Added new observation of PCTFPL: 66.03  (07/14/2009 13:42) Added new observation of HOUSEINCOME: 38756  (07/14/2009 13:42) Added new observation of FINASSESSDT: 06/02/2009  (07/14/2009 13:42)

## 2010-05-29 NOTE — Miscellaneous (Signed)
Summary: clinical update/ryan white [place]ADAP appr til 07/28/10  Clinical Lists Changes  Observations: Added new observation of AIDSDAP: Yes 2011 (07/04/2009 12:16)

## 2010-05-29 NOTE — Progress Notes (Signed)
Summary: [facility]   Phone Note From Other Clinic   Caller: [facility] Dept - Doreene Nest  [phone] Summary of Call: Reported to Health Dept: pt   tested positve for chlamydia on 4- 25-11 .   No history of treatment.  Pt will need treatment.  Pleae advise. Tomasita Morrow RN  October 09, 2009 10:47 AM    Follow-up for Phone Call        Lets give her rx with azithromycin 1 gram orally nd ask her to please practice safe sex with condoms Follow-up by: Paulette Blanch Dam MD,  October 09, 2009 11:36 AM    New/Updated Medications: AZITHROMYCIN 500 MG TABS (AZITHROMYCIN) two tablets once Prescriptions: AZITHROMYCIN 500 MG TABS (AZITHROMYCIN) two tablets once  #2 x 0   Entered and Authorized by:   Acey Lav MD   Signed by:   Paulette Blanch Dam MD on 10/09/2009   Method used:   Electronically to        [facility] ([facility])       [address]       [place], [place]  [address]       Ph: [phone]       Fax: [phone]   RxID:   [id number]   Appended Document: [facility] Dept.  spoke to [facility].  Patient's prescriptions have been transfered to [facility] new ADAP pharmacy for patient.  Prescripton phoned in today has been canceled and recalled into the correct pharmacy.   Clinical Lists Changes  Medications: Rx of AZITHROMYCIN 500 MG TABS (AZITHROMYCIN) two tablets once;  #2 x 0;  Signed;  Entered by: Paulo Fruit  BS,CPht II,MPH;  Authorized by: Acey Lav MD;  Method used: Electronically to [facility] [phone]*, [address], [place], [place]  [address], Ph: [phone], Fax:    Prescriptions: AZITHROMYCIN 500 MG TABS (AZITHROMYCIN) two tablets once  #2 x 0   Entered by:   Paulo Fruit  BS,CPht II,MPH   Authorized by:   Acey Lav MD   Signed by:   Paulo Fruit  BS,CPht II,MPH on 10/09/2009   Method used:   Electronically to        [facility] [phone]* (retail)       [address]       [place], [place]  [address]  Ph: [phone]       Fax:    RxID:   [id number]  Paulo Fruit  BS,CPht II,MPH  October 09, 2009 2:22 PM

## 2010-05-29 NOTE — Miscellaneous (Signed)
  Clinical Lists Changes  Observations: Added new observation of YEARAIDSPOS: 2006  (03/21/2010 9:04)

## 2010-05-29 NOTE — Assessment & Plan Note (Signed)
Summary: HEP B/ONLY/VS  Prior Medications: TRUVADA 200-300 MG  TABS (EMTRICITABINE-TENOFOVIR) Take 1 tablet by mouth once a day KALETRA 200-50 MG  TABS (LOPINAVIR-RITONAVIR) Take 2 tablets by mouth two times a day AZITHROMYCIN 500 MG TABS (AZITHROMYCIN) take two tablets once Current Allergies: No known allergies  Immunizations Administered:  Hepatitis B Vaccine # 5:    Vaccine Type: HepB Adult    Site: left deltoid    Mfr: Merck    Dose: 0.5 ml    Route: IM    Given by: Wendall Mola CMA ( AAMA)    Exp. Date: 01/24/2012    Lot #: [id number]    VIS given: 11/13/05 version given Sep 21, 2009.  Orders Added: 1)  Hepatitis B Vaccine >18yrs [90746] 2)  Admin 1st Vaccine [90471]  Appended Document: Orders Update    Clinical Lists Changes

## 2010-05-30 ENCOUNTER — Other Ambulatory Visit: Payer: Self-pay

## 2010-06-20 ENCOUNTER — Telehealth: Payer: Self-pay | Admitting: Infectious Disease

## 2010-06-26 ENCOUNTER — Telehealth: Payer: Self-pay | Admitting: Infectious Disease

## 2010-06-26 NOTE — Progress Notes (Signed)
Summary: Pt to come for flu shot and labs  Phone Note Outgoing Call   Call placed by: Acey Lav MD,  June 20, 2010 4:30 PM Details for Reason: NEEDs Flu shot Summary of Call: Pt NOT seen since April 2011. Has not had flu shot or labs this fall. She is scheduled for labs in March but I told her she can come any day except Monday for flu shot and labs Initial call taken by: Acey Lav MD,  June 20, 2010 4:30 PM

## 2010-07-02 ENCOUNTER — Other Ambulatory Visit: Payer: Self-pay

## 2010-07-03 ENCOUNTER — Encounter (INDEPENDENT_AMBULATORY_CARE_PROVIDER_SITE_OTHER): Payer: Self-pay | Admitting: *Deleted

## 2010-07-03 ENCOUNTER — Encounter: Payer: Self-pay | Admitting: Infectious Disease

## 2010-07-03 ENCOUNTER — Other Ambulatory Visit: Payer: Self-pay | Admitting: Infectious Disease

## 2010-07-03 ENCOUNTER — Other Ambulatory Visit (INDEPENDENT_AMBULATORY_CARE_PROVIDER_SITE_OTHER): Payer: Self-pay

## 2010-07-03 DIAGNOSIS — B20 Human immunodeficiency virus [HIV] disease: Secondary | ICD-10-CM

## 2010-07-03 LAB — CONVERTED CEMR LAB
ALT: 8 units/L (ref 0–35)
AST: 18 units/L (ref 0–37)
Alkaline Phosphatase: 41 units/L (ref 39–117)
BUN: 11 mg/dL (ref 6–23)
Basophils Absolute: 0 10*3/uL (ref 0.0–0.1)
Basophils Relative: 0 % (ref 0–1)
Creatinine, Ser: 0.93 mg/dL (ref 0.40–1.20)
Eosinophils Absolute: 0.1 10*3/uL (ref 0.0–0.7)
Eosinophils Relative: 2 % (ref 0–5)
HCT: 32.5 % — ABNORMAL LOW (ref 36.0–46.0)
HIV 1 RNA Quant: 54 copies/mL — ABNORMAL HIGH (ref <20)
HIV-1 RNA Quant, Log: 1.73 — ABNORMAL HIGH (ref <1.30)
Hemoglobin: 9.7 g/dL — ABNORMAL LOW (ref 12.0–15.0)
MCHC: 29.8 g/dL — ABNORMAL LOW (ref 30.0–36.0)
MCV: 68.6 fL — ABNORMAL LOW (ref 78.0–100.0)
Monocytes Absolute: 0.3 10*3/uL (ref 0.1–1.0)
Platelets: 242 10*3/uL (ref 150–400)
RDW: 20.2 % — ABNORMAL HIGH (ref 11.5–15.5)
Total CHOL/HDL Ratio: 2.3

## 2010-07-04 ENCOUNTER — Encounter (INDEPENDENT_AMBULATORY_CARE_PROVIDER_SITE_OTHER): Payer: Self-pay | Admitting: *Deleted

## 2010-07-04 LAB — T-HELPER CELL (CD4) - (RCID CLINIC ONLY)
CD4 % Helper T Cell: 25 % — ABNORMAL LOW (ref 33–55)
CD4 T Cell Abs: 460 uL (ref 400–2700)

## 2010-07-05 NOTE — Progress Notes (Addendum)
  Phone Note Outgoing Call   Call placed by: Golden Circle RN,  June 26, 2010 2:31 PM Summary of Call: there was a message that [facility] could not reach her to deliver her meds. I called pt. she will call them now. has appt 07/03/10 for labs & md visit, & flu shot. does not want to make 2 trips here from [facility] Initial call taken by: Golden Circle RN,  June 26, 2010 2:31 PM  Follow-up for Phone Call        [facility]! Follow-up by: Acey Lav MD,  June 26, 2010 3:47 PM     Appended Document:  pt coming in February  Appended Document: DID SHE GET FLU SHOT WITH LABS?    Clinical Lists Changes

## 2010-07-10 NOTE — Miscellaneous (Signed)
  Clinical Lists Changes 

## 2010-07-10 NOTE — Miscellaneous (Signed)
Summary: ADAP/RW  UPDATE  Clinical Lists Changes  Observations: Added new observation of AIDSDAP: Pending-APPROVAL 2012 (07/03/2010 11:13) Added new observation of PCTFPL: 0  (07/03/2010 11:13) Added new observation of INCOMESOURCE: UNEMPLOYED  (07/03/2010 11:13) Added new observation of HOUSEINCOME: 0  (07/03/2010 11:13) Added new observation of #CHILD<18 IN: No  (07/03/2010 11:13) Added new observation of FAMILYSIZE: 1  (07/03/2010 11:13) Added new observation of FINASSESSDT: 07/03/2010  (07/03/2010 11:13)

## 2010-07-16 ENCOUNTER — Other Ambulatory Visit: Payer: Self-pay | Admitting: Infectious Disease

## 2010-07-16 ENCOUNTER — Ambulatory Visit (INDEPENDENT_AMBULATORY_CARE_PROVIDER_SITE_OTHER): Payer: Self-pay | Admitting: Infectious Disease

## 2010-07-16 ENCOUNTER — Encounter: Payer: Self-pay | Admitting: Infectious Disease

## 2010-07-16 DIAGNOSIS — Z862 Personal history of diseases of the blood and blood-forming organs and certain disorders involving the immune mechanism: Secondary | ICD-10-CM

## 2010-07-16 DIAGNOSIS — Z124 Encounter for screening for malignant neoplasm of cervix: Secondary | ICD-10-CM

## 2010-07-16 DIAGNOSIS — B20 Human immunodeficiency virus [HIV] disease: Secondary | ICD-10-CM

## 2010-07-16 DIAGNOSIS — N920 Excessive and frequent menstruation with regular cycle: Secondary | ICD-10-CM

## 2010-07-20 ENCOUNTER — Encounter: Payer: Self-pay | Admitting: *Deleted

## 2010-07-26 NOTE — Letter (Signed)
Summary: Results Follow-up Letter  [facility]  [address]   [place], [place] [address]   Phone: [phone]  Fax: [phone]          07/20/2010  [address] [address] [place], [place]  [address]  [place]  Dear Ms. MENARD,   The following are the results of your recent test(s):  Test     Result     Pap Smear    Normal_XXX___  Not Normal_____  Comments:  Everything was normal.  I will see you in one year for your next PAP smear.  Thank you for coming to the Center for your care.  Sincerely,    Jennet Maduro [facility]

## 2010-07-26 NOTE — Assessment & Plan Note (Signed)
Summary: F/U/OV/VS   Vital Signs:  Patient profile:   40 year old female Menstrual status:  regular LMP:     06/29/2010 Height:      63 inches (160.02 cm) Weight:      226.50 pounds (102.95 kg) BMI:     40.27 Temp:     97.5 degrees F (36.39 degrees C) oral Pulse rate:   97 / minute BP sitting:   139 / 96  (left arm)  Vitals Entered By: Starleen Arms CMA (July 16, 2010 10:09 AM) CC: f/u Is Patient Diabetic? No Pain Assessment Patient in pain? no      Nutritional Status BMI of > 30 = obese  Does patient need assistance? Functional Status Self care Ambulation Normal LMP (date): 06/29/2010 LMP - Character: normal     Menstrual Status regular Enter LMP: 06/29/2010 Last PAP Result Normal   Visit Type:  Follow-up Primary Provider:  Paulette Blanch Dam MD  CC:  f/u.  History of Present Illness: 40 year old African American lady with HIV extremely controlled on her regimen of kaletra ii tabs two times a day and truvada once per day. She has had menorrhagia and was on orthoevra patch in the past but has not been on for a month She also is on iron supplement and would like this sent to her home with mail order pharmacy. She has not other specific complaints today.  Problems Prior to Update: 1)  Periodontal Disease  (ICD-523.9) 2)  Iron Deficiency Anemia, Hx of  (ICD-V12.3) 3)  Excessive Menstrual Bleeding  (ICD-626.2) 4)  Preventive Health Care  (ICD-V70.0) 5)  Ventral Hernia, Repaired  (ICD-553.20) 6)  HIV Disease  (ICD-042)  Medications Prior to Update: 1)  Truvada 200-300 Mg  Tabs (Emtricitabine-Tenofovir) .... Take 1 Tablet By Mouth Once A Day 2)  Kaletra 200-50 Mg  Tabs (Lopinavir-Ritonavir) .... Take 2 Tablets By Mouth Two Times A Day 3)  Azithromycin 500 Mg Tabs (Azithromycin) .... Take Two Tablets Once 4)  Azithromycin 500 Mg Tabs (Azithromycin) .... Two Tablets Once  Current Medications (verified): 1)  Truvada 200-300 Mg  Tabs (Emtricitabine-Tenofovir)  .... Take 1 Tablet By Mouth Once A Day 2)  Azithromycin 500 Mg Tabs (Azithromycin) .... Take Two Tablets Once 3)  Azithromycin 500 Mg Tabs (Azithromycin) .... Two Tablets Once 4)  Ortho Evra 150-20 Mcg/24hr Ptwk (Norelgestromin-Eth Estradiol) .... Apply Per Package Instructions, Once Weekly For 3 Weeks Then Off A Week 5)  Kaletra 200-50 Mg Tabs (Lopinavir-Ritonavir) .... Take Two Tablets Twice Daily  Allergies (verified): No Known Drug Allergies  Past History:  Past Medical History: Last updated: 08/21/2009 HIV disease Ventral hernia, repaired 2005 Dental caries tooth extraction Menorrhagia Irone deficiecny anemia  Past Surgical History: Last updated: 12/17/2006 ventral hernai repaired in 2005  Family History: Last updated: 12/17/2006 Dad with early CAD  Social History: Last updated: 05/19/2008 Divorced  Risk Factors: Caffeine Use: 1 (08/21/2009) Exercise: yes (08/21/2009)  Risk Factors: Smoking Status: never (08/21/2009) Passive Smoke Exposure: no (08/21/2009)  Additional History Menstrual Status:  regular  Review of Systems       see HPI otherwise negative on 12 pt review  Physical Exam  General:  alert, well-developed, and well-nourished.   Head:  normocephalic, atraumatic  Eyes:  vision grossly intact, pupils equal, pupils round, and pupils reactive to light.   Ears:  no external deformities.   Nose:  no external deformity.   Mouth:  no gingival abnormalities, pharynx pink and moist, no erythema, and no  exudates.   Neck:  supple.  full ROM.   Lungs:  normal respiratory effort, no intercostal retractions, no accessory muscle use, normal breath sounds, no crackles, and no wheezes.   Heart:  normal rate, regular rhythm, no murmur, no gallop, and no rub.   Abdomen:  soft, no masses, and abdominal scar(s). tenderness along hernia scar   Msk:  no joint tenderness.  no joint deformities.   Extremities:  No clubbing, cyanosis, edema, or deformity noted with  normal full range of motion of all joints.   Neurologic:  alert & oriented X3.  gait normal.   Skin:  turgor normal, color normal, and no rashes.   Psych:  Oriented X3.  normally interactive, good eye contact, and not anxious appearing.          Medication Adherence: 07/16/2010   Adherence to medications reviewed with patient. Counseling to provide adequate adherence provided   Prevention For Positives: 07/16/2010   Safe sex practices discussed with patient. Condoms offered.   Education Materials Provided: 07/16/2010 Safe sex practices discussed with patient. Condoms offered.                          Impression & Recommendations:  Problem # 1:  HIV DISEASE (ICD-042)  Give flu shot today. She has reasonable control of HIV. Rtc in flu season for next visit.  Her updated medication list for this problem includes:    Azithromycin 500 Mg Tabs (Azithromycin) .Marland Kitchen... Take two tablets once    Azithromycin 500 Mg Tabs (Azithromycin) .Marland Kitchen..Marland Kitchen Two tablets once  Her updated medication list for this problem includes:    Azithromycin 500 Mg Tabs (Azithromycin) .Marland Kitchen... Take two tablets once    Azithromycin 500 Mg Tabs (Azithromycin) .Marland Kitchen..Marland Kitchen Two tablets once  Orders: Est. Patient Level IV (91478)  Problem # 2:  IRON DEFICIENCY ANEMIA, HX OF (ICD-V12.3)  due to menorrhagia, needs to be back on orthoevra patch  Orders: Est. Patient Level IV (29562)  Problem # 3:  EXCESSIVE MENSTRUAL BLEEDING (ICD-626.2)  I wrote rx for ortho evra today. Kaletra does lower levels of this drug but it has controlled her bleeding despite this. continue irone replacment Her updated medication list for this problem includes:    Ortho Evra 150-20 Mcg/24hr Ptwk (Norelgestromin-eth estradiol) .Marland Kitchen... Apply per package instructions, once weekly for 3 weeks then off a week  Orders: Est. Patient Level IV (13086)  Medications Added to Medication List This Visit: 1)  Ortho Evra 150-20 Mcg/24hr Ptwk (Norelgestromin-eth  estradiol) .... Apply per package instructions, once weekly for 3 weeks then off a week 2)  Kaletra 200-50 Mg Tabs (Lopinavir-ritonavir) .... Take two tablets twice daily  Other Orders: T-PAP [facility]) [phone]) Future Orders: T-CD4SP ([facility]) (CD4SP) ... 01/12/2011 T-HIV Viral Load [phone]) ... 01/12/2011 T-CBC w/Diff ([id number]) ... 01/12/2011 T-Comprehensive Metabolic Panel [phone]) ... 01/12/2011 T-RPR (Syphilis) [phone]) ... 01/12/2011 T-Lipid Profile [phone]) ... 01/12/2011  Patient Instructions: 1)  rtc in September for labs and routine visit  Prescriptions: KALETRA 200-50 MG TABS (LOPINAVIR-RITONAVIR) take two tablets twice daily  #120 x 11   Entered by:   Starleen Arms CMA   Authorized by:   Acey Lav MD   Signed by:   Starleen Arms CMA on 07/16/2010   Method used:   Print then Give to Patient   RxID:   [id number] ORTHO EVRA 150-20 MCG/24HR PTWK (NORELGESTROMIN-ETH ESTRADIOL) apply per package instructions, once weekly for 3 weeks then off a  week  #3 x 11   Entered by:   Starleen Arms CMA   Authorized by:   Acey Lav MD   Signed by:   Starleen Arms CMA on 07/16/2010   Method used:   Print then Give to Patient   RxID:   [id number]    Orders Added: 1)  T-CD4SP [facility]) [CD4SP] 2)  T-HIV Viral Load [phone] 3)  T-CBC w/Diff [[id number]] 4)  T-Comprehensive Metabolic Panel [[id number]] 5)  T-RPR (Syphilis) [[id number]] 6)  T-Lipid Profile [[id number]] 7)  T-PAP [facility]) [88142] 8)  Est. Patient Level IV [44010]   Immunization History:  Influenza Immunization History:    Influenza:  fluvax 3+ (07/16/2010)   Immunization History:  Influenza Immunization History:    Influenza:  Fluvax 3+ (07/16/2010)  Influenza Vaccine (to be given today)        Medication Adherence: 07/16/2010   Adherence to medications reviewed with patient. Counseling to provide adequate adherence provided      Prevention For Positives: 07/16/2010   Safe sex practices discussed with patient. Condoms offered.

## 2010-08-13 LAB — T-HELPER CELL (CD4) - (RCID CLINIC ONLY)
CD4 % Helper T Cell: 26 % — ABNORMAL LOW (ref 33–55)
CD4 T Cell Abs: 310 uL — ABNORMAL LOW (ref 400–2700)

## 2010-08-14 ENCOUNTER — Other Ambulatory Visit: Payer: Self-pay | Admitting: Infectious Disease

## 2010-08-14 ENCOUNTER — Encounter: Payer: Self-pay | Admitting: Infectious Disease

## 2010-08-14 DIAGNOSIS — B20 Human immunodeficiency virus [HIV] disease: Secondary | ICD-10-CM

## 2010-08-14 MED ORDER — LOPINAVIR-RITONAVIR 200-50 MG PO TABS: 2.0000 | ORAL_TABLET | Freq: Two times a day (BID) | ORAL | Status: DC

## 2010-08-14 MED ORDER — EMTRICITABINE-TENOFOVIR DF 200-300 MG PO TABS: 1.0000 | ORAL_TABLET | Freq: Every day | ORAL | Status: DC

## 2010-09-11 NOTE — Op Note (Signed)
NAMEJOHNNIE, Samantha Norris                 ACCOUNT NO.:  [id number]   MEDICAL RECORD NO.:  [id number]          PATIENT TYPE:  OIB   LOCATION:  5710                         FACILITY:  [facility]   PHYSICIAN:  Leonie Man, M.D.   DATE OF BIRTH:  15-Feb-1971   DATE OF PROCEDURE:  01/14/2007  DATE OF DISCHARGE:                               OPERATIVE REPORT   PREOPERATIVE DIAGNOSIS:  Recurrent ventral hernia.   POSTOPERATIVE DIAGNOSIS:  Recurrent ventral hernia.   PROCEDURE:  Repair of recurrent ventral hernia with mesh.   SURGEON:  Leonie Man, M.D.   ASSISTANT:  Currie Paris, M.D.   ANESTHESIA:  General.   The patient is a 40 year old female who has a two time recurrent ventral  hernia, who comes to the operating room now for repair after the risks  and potential benefits of surgery have been fully discussed.  All  questions answered and consent obtained.   The patient is positioned supine, then following the induction of  satisfactory general anesthesia, the abdomen is prepped and draped to be  included in the sterile operative field.  An elliptical incision in the  lower abdomen is carried down, taken through the skin and subcutaneous  tissue with excision of the old scar cicatrix.  Dissection is carried to  the right lateral side where the hernia presents and the hernia sac is  then encountered, dissected free from the surrounding tissue and carried  down to its neck.  Redundant hernia sac was removed and forwarded for  pathologic evaluation.  There were no adhesions to the inside of  anterior portions of the hernia.  Palpation of the fascia superiorly,  inferiorly, laterally on both sides did not reveal any additional  defects.  The defect was repaired with a large Bard Ventralex mesh 8 cm  placed within the defect and sutured to the abdominal wall with  interrupted #1 Novafil sutures.  Having accomplished this, the fascia  over the Ventralex mesh was then closed with  interrupted #1 sutures.  All the areas of dissection were checked for hemostasis, noted to dry.  Sponge and instrument counts were doubly verified.  I placed a 69 [place]  Blake drain in the right flank due to this is where the hernia presented  and there was a significantly large dead space in this area.  Subcutaneous tissues were irrigated and closed with  interrupted 2-0 Vicryl sutures.  The skin was closed with a running 4-0  Monocryl suture and then reinforced with Steri-Strips.  Sterile  dressings applied, the anesthetic reversed, the patient removed from the  operating room to the recovery room in stable condition.  She tolerated  the procedure well.      Leonie Man, M.D.  Electronically Signed     PB/MEDQ  D:  01/14/2007  T:  01/14/2007  Job:  [id number]   cc:   Leonie Man, M.D.

## 2010-09-14 NOTE — Op Note (Signed)
Samantha Norris, Samantha Norris                 ACCOUNT NO.:  [id number]   MEDICAL RECORD NO.:  [id number]          PATIENT TYPE:  OIB   LOCATION:  5702                         FACILITY:  [facility]   PHYSICIAN:  Leonie Man, M.D.   DATE OF BIRTH:  June 01, 1970   DATE OF PROCEDURE:  04/04/2004  DATE OF DISCHARGE:                                 OPERATIVE REPORT   PREOPERATIVE DIAGNOSIS:  Abdominal wall abscess.   POSTOPERATIVE DIAGNOSIS:  Abdominal wall abscess.   PROCEDURE:  Incision and drainage of abdominal wall abscess and wound  lavage.   SURGEON:  Leonie Man, M.D.   ANESTHESIA:  General.   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  She had drains removed  thereafter on April 02, 2004.  She is admitted now with abdominal pain and  swelling.  With CT scan done at [facility] showing an abscess in the  abdominal wall, she was transferred down to [place] for further treatment.   The risks and benefits of surgery have been discussed with the patient and  with her spouse; they understand these risks and accept, and give consent to  surgery.   PROCEDURE:  Following the induction of satisfactory general anesthesia, the  abdomen was thoroughly prepped and draped to be included in the sterile  operative field.  The old abdominal wound was opened up down to this large  abscess cavity, which was cultured for both aerobic and anaerobic bacteria.  All the pus was sucked out of the entire abdominal wound and the wound was  multiply irrigated and pulse-lavaged with approximately 3 L of normal  saline.  The wound was then packed with saline-soaked gauze, a sterile  dressing applied and the patient removed from the operating room to the  recovery room in stable condition.  She tolerated the procedure well.      Patr   PB/MEDQ  D:  04/04/2004  T:  04/05/2004  Job:  [id number]

## 2010-09-14 NOTE — Op Note (Signed)
Samantha Norris, Samantha Norris                 ACCOUNT NO.:  [id number]   MEDICAL RECORD NO.:  [id number]          PATIENT TYPE:  OIB   LOCATION:  5702                         FACILITY:  [facility]   PHYSICIAN:  Sandria Bales. Ezzard Standing, M.D.  DATE OF BIRTH:  12/16/1970   DATE OF PROCEDURE:  04/05/2004  DATE OF DISCHARGE:                                 OPERATIVE REPORT   PREOPERATIVE DIAGNOSIS:  Lack of intravenous access, needs intravenous  antibiotics and blood.   POSTOPERATIVE DIAGNOSIS:  Lack of intravenous access, needs intravenous  antibiotics and blood.   PROCEDURE:  Left subclavian triple-lumen catheter.   SURGEON:  Sandria Bales. Ezzard Standing, M.D.   ANESTHESIA:  10 mL 1% Xylocaine.   COMPLICATIONS:  None.   INDICATION FOR PROCEDURE:  [name] is a 40 year old black female admitted  with an abdominal wall abscess.  They have tried both IV sticks and PICC  line today, all unsuccessfully, and I have been called to place a central  line.   The patient's left chest was prepped with Betadine solution.  I infiltrated  about 10 mL of 1% Xylocaine.  It took me about three or four sticks to get  into the left subclavian vein, but I threaded the guidewire without  difficulty, got good back flow using the Arrow kit.   Each port was flushed with saline and then got good back flow on the central  port.  It was sewn in place with 3-0 silk suture, sterilely dressed.  A  chest x-ray is pending at the time of this dictation.  The patient tolerated  the procedure well.      Davi   DHN/MEDQ  D:  04/05/2004  T:  04/06/2004  Job:  [id number]   cc:   Leonie Man, M.D.  [address] [address]  [address]  [place]  [place] [address]  Fax: [phone]

## 2010-09-14 NOTE — Op Note (Signed)
NAME:  [name], Samantha Norris NO.:  [id number]   MEDICAL RECORD NO.:  [id number]                   PATIENT TYPE:  OIB   LOCATION:  5739                                 FACILITY:  [facility]   PHYSICIAN:  Leonie Man, M.D.                DATE OF BIRTH:  November 23, 1970   DATE OF PROCEDURE:  DATE OF DISCHARGE:                                 OPERATIVE REPORT   PREOPERATIVE DIAGNOSIS:  Ventral hernia.   POSTOPERATIVE DIAGNOSIS:  Ventral hernia.   PROCEDURE:  Repair of ventral hernia with mesh.   SURGEON:  Leonie Man, M.D.   ASSISTANT:  Joanne Gavel, M.D.   ANESTHESIA:  General.   INDICATIONS FOR PROCEDURE:  This patient is a 40 year old female status post  cesarean sections with a significant wound infection which eventually  evolved into a very large ventral hernia extending from the umbilicus down  to the pubic tubercle.  She comes to the operating room now for repair of a  ventral hernia.  The patient has a secondary problem in that she is HIV  positive.  Her viral load is now very low to undetectable and she currently  is on her medications.  The risks and potential benefits of hernia surgery  have been discussed with her.  She understands these risks and consents to  surgery.   PROCEDURE:  Following the induction of satisfactory general anesthesia the  patient was positioned supine and the abdomen was prepped and draped in a  sterile operative field.  A midline incision was made extending from the  umbilicus down towards the pubis, deepened through the skin and subcutaneous  tissue carrying dissection down to the hernia sac.  The hernia sac was  opened and the sac dissected down to the fascia.  As noted, the entire area  of the sac extended to the entire lower midline.  The sac was dissected  free, removed and forwarded for pathologic evaluation.  I put two layers of  Seprafilm mesh to separate the bowel from the overlying mesh.  I used a Bard  part of  opening mesh and sewed this in at the umbilicus and carried it along  the fascial edge with #1 Novofil suture completing the entire suture line.  At the end of the repair the repair was intact.  Sponge and instrument  counts were verified.  Some redundant skin was removed from the anterior  abdominal wall.  The subcutaneous tissues were then closed with running 2-0  Vicryl suture and the skin was closed with 4-0 Monocryl suture and then  reinforced with Steri-Strips.  A sterile dressing was applied. Anesthesia  was reversed.  The patient was removed from the operating room to the  recovery room in stable condition.  She tolerated the procedure well.  Leonie Man, M.D.    PB/MEDQ  D:  11/12/2002  T:  11/13/2002  Job:  [id number]

## 2010-09-14 NOTE — Op Note (Signed)
Samantha Norris, Samantha Norris                 ACCOUNT NO.:  [id number]   MEDICAL RECORD NO.:  [id number]          PATIENT TYPE:  OIB   LOCATION:  2899                         FACILITY:  [facility]   PHYSICIAN:  Leonie Man, M.D.   DATE OF BIRTH:  November 08, 1970   DATE OF PROCEDURE:  03/26/2004  DATE OF DISCHARGE:                                 OPERATIVE REPORT   PREOPERATIVE DIAGNOSIS:  Recurrent ventral hernia.   POSTOPERATIVE DIAGNOSIS:  Recurrent ventral hernia.   PROCEDURE:  Repair of recurrent ventral hernia with mesh.   SURGEON:  Leonie Man, M.D.   ASSISTANT:  Nurse.   ANESTHESIA:  General.   INDICATIONS FOR PROCEDURE:  This patient is a 40 year old woman who  underwent ventral hernia repair with mesh in July 2004.  She returns now  with an enlarging abdominal wall bulge.  She has also been having some  increase of her problems in that she has intermittent constipation and  cramping abdominal pain from time to time.  She has not had any nausea or  vomiting, or any symptoms suggestive of any small-bowel obstruction.  She currently weighs approximately 230 pounds at 5 feet, 3 inches tall and  has gained some moderate amount of weight since her repair last year.   The risks and potential benefits of surgery have been fully discussed with  the patient. All questions answered and consents obtained.   DESCRIPTION OF PROCEDURE:  Following the induction of satisfactory general  anesthesia, patient was positioned supine and the abdomen was prepped and  draped routinely.  A midline incision below the umbilicus was carried down  through the skin and subcutaneous tissue, carrying the dissection down to  the area in the midline where the old hernia sac was encountered.  The old  mesh had been pulled away somewhat laterally.   A skin flap was raised to the right lateral side to completely encircle the  sac, carrying the dissection down to the fascia. The mesh was then removed  and adhesions  to the anterior abdominal wall and to the hernia sac were then  taken down.  The skin flaps were raised on both lateral sides, superiorly  and inferiorly, thus releasing the fascia to be closed primarily.  Sponge,  instrument and sharps counts were verified.  The fascia was closed primarily  with a running #1 Novofil suture.  Overlay of polyethylene mesh was then  sutured into the fascia at the 12 o'clock, 6 o'clock, 3 and 9 o'clock, 2  o'clock, 4 o'clock, 8 o'clock and 10 o'clock positions.  I used the  laparoscopic stapler completely around the edge of the mesh for security, to  secure this to the abdominal wall fascia. The wound was then thoroughly  irrigated with normal saline.   I placed a #19 [place] Blake drain onto the flaps and then the subcutaneous  tissues were closed with a running suture of 2-0 Novofil.  Skin was closed with staples. A sterile dressing was applied, the anesthetic  reversed, and the patient moved from the operating room to the recovery room  in stable condition.  She tolerated the procedure well.      Patr   PB/MEDQ  D:  03/26/2004  T:  03/26/2004  Job:  [id number]

## 2010-09-14 NOTE — Consult Note (Signed)
[place]  Patient:    Samantha Norris, Samantha Norris Visit Number: [id number] MRN: [id number]          Service Type: OBS Location: [facility] Attending Physician:  Tonye Royalty Dictated by:   Petra Kuba, M.D. Proc. Date: 09/01/01 Admit Date:  08/31/2001 Discharge Date: 08/31/2001   CC:         Tinnie Gens C. Ninetta Lights, M.D.  Juan H. Lily Peer, M.D.   Consultation Report  HISTORY OF PRESENT ILLNESS:  The patient is seen at the request of the OB doctors for persistent nausea and vomiting.  She is [redacted] weeks pregnant and this started about three to four weeks ago.  She has not had any previous GI problems.  Did have a bout of dysphagia treated empirically with Diflucan which helped, but has continued to have nausea and vomiting.  Workup so far has included negative lab parameters and a negative ultrasound.  She had an uneventful prior pregnancy.  PAST MEDICAL HISTORY:  Pertinent for HIV positive since March of 2000.  PAST SURGICAL HISTORY:  Her only surgery is a C-section.  SOCIAL HISTORY:  Does not drink, use drugs, or tobacco.  FAMILY HISTORY:  Negative for any GI problems.  ALLERGIES:  No known drug allergies.  CURRENT MEDICATIONS:  Viracept, Bactrim, Diflucan, prenatal vitamins, Combivir, iron pills, and Tylenol.  REVIEW OF SYSTEMS:  Pertinent for her feeling better on the Zofran pump.  PHYSICAL EXAMINATION:  VITAL SIGNS:  See chart, low grade fever.  GENERAL:  No acute distress.  HEENT:  Sclerae are nonicteric.  NECK:  Supple without adenopathy.  LUNGS:  Clear.  HEART:  Regular rate and rhythm.  ABDOMEN:  Soft and nontender currently.  LABORATORY DATA:  Pertinent for normal liver tests.  Ultrasound with mild splenomegaly, otherwise negative.  ASSESSMENT:  Human immunodeficiency positive and [redacted] weeks pregnant with some nausea and vomiting.  Do not think it is due to hyperemesis per the Ob/Gyn team.  PLAN:  Per and OB and ID  request, we will proceed with endoscopy.  The risks, benefits, and methods of that were discussed.  For further workup and plans, please see that dictation.  We will be on standby to help otherwise p.r.n. and leave care, further workup, and plans to the above mentioned doctor. Dictated by:   Petra Kuba, M.D. Attending Physician:  Tonye Royalty DD:  09/01/01 TD:  09/02/01 Job: [id number]

## 2010-09-14 NOTE — Discharge Summary (Signed)
[name], [name]                 ACCOUNT NO.:  [id number]   MEDICAL RECORD NO.:  [id number]          PATIENT TYPE:  INP   LOCATION:  5702                         FACILITY:  [facility]   PHYSICIAN:  Leonie Man, M.D.   DATE OF BIRTH:  06-11-1970   DATE OF ADMISSION:  04/04/2004  DATE OF DISCHARGE:  04/10/2004                                 DISCHARGE SUMMARY   ADMISSION DIAGNOSES:  Abdominal wall abscess, status post ventral hernia  repair.   DISCHARGE DIAGNOSES:  1.  Abdominal wall abscess, status post ventral hernia repair.  2.  HIV status.   PROCEDURES IN HOSPITAL:  I&D of abdominal wall abscess with wound lavage.   CONSULTATIONS:  Infectious disease.   CULTURES OBTAINED:  (In surgery) Staphylococcus aureus, methicillin-  sensitive.   HOSPITAL COURSE:  Ms. Samantha Norris is a 40 year old woman with HIV status,  who was status post ventral hernia repair on March 27, 2004.  Drains were  removed from her abdomen on April 02, 2004.   She was seen at the [facility] for abdominal pain on the day of  admission.  The CT scan of the abdomen was consistent with an abscess of the  abdominal wall.  She was admitted to the hospital and taken to the operating  room at [facility] on April 04, 2004, where she underwent a lavage and  drainage of her abscess.  Cultures showing Staphylococcus aureus species  that are methicillin sensitive.   She was continued on vancomycin briefly but, upon discovering this to be a  methicillin-sensitive Staphylococcus aureus, she was switched to oral  Keflex.   She is being discharged from the office, to be followed up at home with  visiting nurse services for dressing and wound care.  She will be seen in  the office on a weekly basis thereafter.   ACTIVITY:  As tolerated.   DIET:  Not restricted.   DISCHARGE MEDICATIONS:  1.  Keflex 500 mg t.i.d.  2.  Vicodin 1-2 q.4h. p.r.n. for pain.      PB/MEDQ  D:  06/27/2004  T:   06/28/2004  Job:  [id number]

## 2010-09-14 NOTE — Discharge Summary (Signed)
[place]  Patient:    Samantha Norris, Samantha Norris Visit Number: [id number] MRN: [id number]          Service Type: Attending:  Karn Pickler, M.D. Dictated by:   Mont Dutton, M.D. Adm. Date:  08/31/01 Disc. Date: 09/03/01   CC:         Katy Fitch, M.D.   Discharge Summary  DISCHARGE DIAGNOSES: 1. Persistent emesis. 2. Human immunodeficiency virus. 3. Hypokalemia. 4. Anemia. 5. Intrauterine pregnancy at approximately 22 weeks.  CONSULTS:  Gastroenterology, Dr. Towanda Malkin.  PROCEDURES:  EGD on Aug 31, 2001 significant for a tiny hiatal hernia with questionable minimal distal reflux esophagitis.  Minimal proximal gastritis, status post biopsy, otherwise normal EGD to the proximal third or fourth part of the duodenal. Status post duodenal esophageal antral biopsy to rule out microscopic abnormality.  Pathology:  Mild chronic duodenitis.  No significant villous atrophy in duodenum and stomach.  There was chronic active gastritis with Helicobacter pylori.  No intestinal metaplasia or malignancy identified. Distal esophagus biopsy is indicative of benign esophageal mucosa, cardia type mucosa with chronic gastritis.  No intestinal metaplasia or dysphagia identified.  ADMISSION HISTORY OF PRESENT ILLNESS:   The patient is a 40 year old, Gravida 2, Para 1 female with history of HIV who presented at [redacted] weeks pregnant as a transfer from her OB/GYN secondary to persistent emesis. She stated this had started three to four weeks prior after experiencing increased post nasal drip.  She was given antibiotics, Amoxicillin for a questionable sinusitis or ear infection.  Shortly after appeared to have thrush seen by infectious disease physician, Dr. Maurice March who gave the patient Diflucan and Acyclovir and Lidocaine.  The patient finished medications yet still had pain and dysphagia. Restarted Diflucan. For the first four to five days, the patient  noted increased dysphagia and vomiting.  Could not keep food down and vomited three to four post meal.  The patient was seen at [facility] times three.  She was given fluids and Zofran with minimal relief. In this time period was also treated with Ancef IV for UTI and had a negative abdominal ultrasound.  The patient in the interim had been given a Zofran pump and Reglan for her symptoms.  PHYSICAL EXAMINATION:  On admission exam she was afebrile.  Temperature 99.1, heart rate 81, respiratory rate 20.  Blood pressure 137/67, 90% saturation on room air.  She is noted to be in no apparent distress. She has positive bowel sounds, soft abdomen.  Nontender.  No guarding or rebound.  The remainder of the exam was within normal limits.  ADMISSION LABORATORY DATA:  Urinalysis was negative for nitrate and leukocyte esterase.  Lactic acid was 1.0.  CBC:  White blood cell was 5.9, hemoglobin 10, platelets 166.  ANC of 4.7.  Admission BMP: Sodium 133, potassium 3.1, chloride 101, bicarbonate 26, BUN 7, creatinine 0.7, glucose 76.  Total bilirubin 0.7, alkaline phosphatase 43, AST 16, ALT 8, albumin 3.2.  Abdominal ultrasound shows mild splenomegaly, otherwise normal.  Chest x-ray per OB at [facility] was negative.  The patient was admitted to [facility] for further workup of persistent emesis.  HOSPITAL COURSE:  #1 - PERSISTENT EMESIS:  The patient was placed on Zofran pump, was given Reglan and fluids.  The emesis was controlled with the Zofran pump.  Thought to be possible hyperemesis gravidarum.  She underwent a esophagogastroduodenoscopy to rule out gastritis.  Results are as listed above under  procedures.  Only indicative of current gastritis secondary to her persistent emesis.  The patient was started on doxylamine with pyridoxine for treatment of hyperemesis gravidarum.  Her antiretrovirals and Bactrim were held momentarily as she had  difficulty with p.o. medications.  As no major abnormal findings on EGD no further management changes were made to her regimen.  She was continued on Unisom, pyridoxine and Zofran.  Zofran was controlling her episodes of emesis well.  She was instructed to continue this as an outpatient.  If she was unable to obtain pump she would use Zofran elixir. Her progress and plan was discussed with her obstetrician/gynecologist while still in hospital and prior to discharge.  #2 - HUMAN IMMUNODEFICIENCY VIRUS:  The patient had a CD4 of 190 and a viral load of 25,000 on April 2003.  She had been off her medications for approximately 1-1/2 years but were resumed approximately one to two weeks ago. She was restarted on her Combivir and Viracept and Bactrim in hospital; however, had to be held at one point secondary to her difficulty with p.o. medications.  She was to follow up with infectious disease clinic after discharge for normal appointment and discussion of medication regimen.  #3 - HYPOKALEMIA:  Thought to be secondary to persistent emesis.  This was repleted with K-Dur.  The patients potassium had been repleted prior to discharge with potassium chloride and K-Dur.  #4 - ANEMIA:  No upper GI bleed was noted.  Her hemoglobin during hospitalization reached a nadir of 8.4 and increased 8.8 prior to discharge, though to be somewhat anemia of pregnancy.  She was continued on iron sulfate for the management per obstetrician/gynecologist upon discharge.  #5 - INTRAUTERINE PREGNANCY AT 22 WEEKS:  The patient was continued on prenatal vitamins and iron sulfate and received Doppler tones for assessment of fetal well being q.d.  No further intervention during hospitalization.  The patient was discharged on Sep 03, 2001.  DISCHARGE MEDICATIONS: 1. Continue Zofran pump. 2. Unisom 12.5 mg one tab p.o. t.i.d. 3. Pyridoxine 25 mg p.o. t.i.d. 4. Reglan 5 mg per 5 ml solution take 10 ml p.o. q.2-3h.  p.r.n. 5. The patient was instructed not to resume antiviral medications until     after her appointment with infectious disease physician, Dr. Roxan Hockey.  SPECIAL INSTRUCTIONS:  The patient was given no further restrictions here in hospital and was instructed to check with her obstetrician/gynecologist for activity restrictions.  DIET:  The patient was instructed to increase diet very slowly as tolerated.  FOLLOW-UP:  Was scheduled with Dr. Katy Fitch, her obstetrician/gynecologist at [facility] on the day of discharge at 3:15 p.m.  DISPOSITION:  Discharged home in stable condition on Sep 03, 2001.Dictated by: Mont Dutton, M.D. Attending:  Karn Pickler, M.D. DD:  11/04/01 TD:  11/07/01 Job: [id number]

## 2010-09-14 NOTE — Procedures (Signed)
[place]  Patient:    Samantha Norris, Samantha Norris Visit Number: [id number] MRN: [id number]          Service Type: OBS Location: [facility] Attending Physician:  Tonye Royalty Dictated by:   Petra Kuba, M.D. Proc. Date: 09/01/01 Admit Date:  08/31/2001 Discharge Date: 08/31/2001   CC:         Tinnie Gens C. Ninetta Lights, M.D.  Juan H. Lily Peer, M.D.  Inetta Fermo Griffin-Harris   Procedure Report  PROCEDURE:  Esophagogastroduodenoscopy with biopsy.  INDICATION:  Patient with HIV positive, persistent nausea and vomiting. Requested by OB/GYN and and ID team to rule out any specific abnormality. Consent was signed after risks, benefits, methods, and options thoroughly discussed prior to any premedications given.  MEDICATIONS:  Demerol 60 mg, Versed 7 mg.  DESCRIPTION OF PROCEDURE:  The video endoscope was inserted by direct vision. The esophagus was normal.  Possibly there was a touch of distal esophagitis and a tiny hiatal hernia.  The scope was passed into the stomach and advanced through a normal antrum into a normal pylorus, into a normal duodenal bulb, around the C-loop to a normal second and probably third part of the duodenum, possibly even advanced into the fourth part of the duodenum.  The scope was slowly withdrawn.  No duodenal abnormalities were seen.  The duodenal bulb was normal.  I went ahead and took a few distal duodenal biopsies to rule out any microscopic abnormality.  The scope was withdrawn back to the stomach, which was evaluated on straight and retroflexion visualization and other than some proximal gastritis, no abnormalities were seen.  I took a few biopsies of the antrum and two of the proximal stomach to rule out any microscopic abnormalities.  Air was suctioned and the scope slowly withdrawn.  Again a good look at the esophagus confirmed the above findings.  A few scattered distal esophageal biopsies were obtained to rule out any  microscopic abnormalities, put in a separate container.  The scope was removed.  The patient tolerated the procedure well.  There was no obvious immediate complication.  ENDOSCOPIC DIAGNOSES: 1. Tiny hiatal hernia questionable minimal distal reflux esophagitis. 2. Minimal proximal gastritis, status post biopsy. 3. Otherwise normal EGD to possibly the third or fourth part of the duodenum,    status post duodenal, esophageal, and antral biopsies to rule out a    microscopic abnormality.  PLAN:  Await pathology.  Happy to see back p.r.n.  Follow up for questions or problems, otherwise return care to the ID internal medicine team and OB/GYN. Will stand by to help p.r.n.  Okay with me to use H2 blocker or pump inhibitors if okay with the ID team. Dictated by:   Petra Kuba, M.D. Attending Physician:  Tonye Royalty DD:  09/01/01 TD:  09/02/01 Job: [id number]

## 2010-09-14 NOTE — Group Therapy Note (Signed)
NAME:  Samantha Norris, Samantha Norris NO.:  [id number]   MEDICAL RECORD NO.:  [id number]                   PATIENT TYPE:  OUT   LOCATION:  [facility]                           FACILITY:  [facility]   PHYSICIAN:  Elsie Lincoln, MD                   DATE OF BIRTH:  January 17, 1971   DATE OF SERVICE:  03/22/2003                                    CLINIC NOTE   HISTORY OF PRESENT ILLNESS:  The patient is a 40 year old female who is HIV  positive who states that she has been told she has dysplasia.  We have  obtained records from [facility] and [facility].  The only Pap smear  we see is from November 2002 which is reported as normal.  There was  supposed to be a colposcopy done today; however, since there is no evidence  that the patient has any dysplasia, we will just repeat the Pap smear so the  result will be here on our charts.  The patient also complaining of some  heaviness and burning on the outside of her vagina.  No discharge.  No  pelvic pain.  No change in sexual partners.   PHYSICAL EXAMINATION:  VITAL SIGNS:  Temperature 98.1, pulse 84, blood  pressure 121/88, weight 239.6, height 5 feet 3 inches.  PELVIC:  External genitalia Tanner 5.  No evidence of warts or lesions on  the external genitalia.  The vestibule is nontender.  The vagina is pink,  healthy mucosa, minimal discharge, questionably yeast.  Cervix is grossly  normal, nontender.   ASSESSMENT/PLAN:  A 40 year old HIV positive lady for Pap smear and  evaluation of vaginal burning.  1. Pap smear done.  2. GC cultures and wet prep done.  3. Will treat empirically for yeast with Diflucan one tablet.  4. Return to clinic in a year if not earlier secondary to unresolved vaginal     burning or abnormality on the Pap smear.                                               Elsie Lincoln, MD    KL/MEDQ  D:  03/22/2003  T:  03/22/2003  Job:  [id number]

## 2010-12-21 ENCOUNTER — Ambulatory Visit: Payer: Self-pay

## 2011-01-30 ENCOUNTER — Other Ambulatory Visit: Payer: Self-pay | Admitting: *Deleted

## 2011-01-30 DIAGNOSIS — B20 Human immunodeficiency virus [HIV] disease: Secondary | ICD-10-CM

## 2011-01-30 MED ORDER — LOPINAVIR-RITONAVIR 200-50 MG PO TABS: 2.0000 | ORAL_TABLET | Freq: Two times a day (BID) | ORAL | Status: DC

## 2011-01-30 MED ORDER — EMTRICITABINE-TENOFOVIR DF 200-300 MG PO TABS: 1.0000 | ORAL_TABLET | Freq: Every day | ORAL | Status: DC

## 2011-02-07 LAB — CBC
HCT: 37.5
Hemoglobin: 12.1
MCHC: 32.2
MCV: 78.5
Platelets: 172
RDW: 15.1 — ABNORMAL HIGH

## 2011-02-07 LAB — COMPREHENSIVE METABOLIC PANEL
Alkaline Phosphatase: 40
BUN: 6
Calcium: 9.4
Creatinine, Ser: 0.94
Glucose, Bld: 92
Total Protein: 7.5

## 2011-02-07 LAB — PROTIME-INR: INR: 1.1

## 2011-02-07 LAB — DIFFERENTIAL
Basophils Relative: 0
Lymphocytes Relative: 33
Monocytes Relative: 4
Neutro Abs: 4
Neutrophils Relative %: 62

## 2011-02-07 LAB — APTT: aPTT: 28

## 2011-02-11 LAB — T-HELPER CELL (CD4) - (RCID CLINIC ONLY): CD4 T Cell Abs: 250 — ABNORMAL LOW

## 2011-06-03 ENCOUNTER — Encounter: Payer: Self-pay | Admitting: *Deleted

## 2011-07-16 ENCOUNTER — Other Ambulatory Visit: Payer: Self-pay | Admitting: Infectious Disease

## 2011-07-16 ENCOUNTER — Ambulatory Visit: Payer: Self-pay

## 2011-07-16 DIAGNOSIS — B20 Human immunodeficiency virus [HIV] disease: Secondary | ICD-10-CM

## 2011-07-16 DIAGNOSIS — Z79899 Other long term (current) drug therapy: Secondary | ICD-10-CM

## 2011-07-16 DIAGNOSIS — Z113 Encounter for screening for infections with a predominantly sexual mode of transmission: Secondary | ICD-10-CM

## 2011-07-22 ENCOUNTER — Other Ambulatory Visit: Payer: Medicaid Other

## 2011-07-22 DIAGNOSIS — Z79899 Other long term (current) drug therapy: Secondary | ICD-10-CM

## 2011-07-22 DIAGNOSIS — B20 Human immunodeficiency virus [HIV] disease: Secondary | ICD-10-CM

## 2011-07-22 DIAGNOSIS — Z113 Encounter for screening for infections with a predominantly sexual mode of transmission: Secondary | ICD-10-CM

## 2011-07-22 LAB — CBC WITH DIFFERENTIAL/PLATELET
Basophils Absolute: 0 10*3/uL (ref 0.0–0.1)
Basophils Relative: 0 % (ref 0–1)
Eosinophils Absolute: 0.2 10*3/uL (ref 0.0–0.7)
Lymphs Abs: 1.7 10*3/uL (ref 0.7–4.0)
MCH: 23 pg — ABNORMAL LOW (ref 26.0–34.0)
MCHC: 30.1 g/dL (ref 30.0–36.0)
Neutrophils Relative %: 66 % (ref 43–77)
Platelets: 211 10*3/uL (ref 150–400)
RBC: 4.91 MIL/uL (ref 3.87–5.11)

## 2011-07-22 LAB — COMPREHENSIVE METABOLIC PANEL
ALT: 9 U/L (ref 0–35)
AST: 11 U/L (ref 0–37)
Alkaline Phosphatase: 55 U/L (ref 39–117)
Sodium: 137 mEq/L (ref 135–145)
Total Bilirubin: 0.5 mg/dL (ref 0.3–1.2)
Total Protein: 7 g/dL (ref 6.0–8.3)

## 2011-07-22 LAB — LIPID PANEL
HDL: 64 mg/dL (ref >39)
LDL Cholesterol: 97 mg/dL (ref 0–99)
Total CHOL/HDL Ratio: 3.4 Ratio

## 2011-07-22 LAB — RPR

## 2011-07-23 ENCOUNTER — Other Ambulatory Visit: Payer: Self-pay | Admitting: *Deleted

## 2011-07-23 DIAGNOSIS — B20 Human immunodeficiency virus [HIV] disease: Secondary | ICD-10-CM

## 2011-07-23 DIAGNOSIS — Z113 Encounter for screening for infections with a predominantly sexual mode of transmission: Secondary | ICD-10-CM

## 2011-08-02 ENCOUNTER — Encounter (INDEPENDENT_AMBULATORY_CARE_PROVIDER_SITE_OTHER): Payer: Self-pay | Admitting: Surgery

## 2011-08-07 ENCOUNTER — Telehealth: Payer: Self-pay

## 2011-08-07 NOTE — Telephone Encounter (Signed)
ADAP office called saying she was terminated from program, due to fact she has insurance [facility]) she also brought in her Medicaid card as well. I told her they would deny her and she needed to get meds with her Medicaid.

## 2011-08-23 ENCOUNTER — Ambulatory Visit (INDEPENDENT_AMBULATORY_CARE_PROVIDER_SITE_OTHER): Payer: Medicaid Other | Admitting: *Deleted

## 2011-08-23 ENCOUNTER — Encounter (INDEPENDENT_AMBULATORY_CARE_PROVIDER_SITE_OTHER): Payer: Self-pay | Admitting: Surgery

## 2011-08-23 ENCOUNTER — Ambulatory Visit (INDEPENDENT_AMBULATORY_CARE_PROVIDER_SITE_OTHER): Payer: Medicaid Other | Admitting: Surgery

## 2011-08-23 ENCOUNTER — Other Ambulatory Visit (HOSPITAL_COMMUNITY)
Admission: RE | Admit: 2011-08-23 | Discharge: 2011-08-23 | Disposition: A | Discharge status: Home / Self Care | Payer: Medicaid Other | Source: Ambulatory Visit | Attending: Infectious Diseases | Admitting: Infectious Diseases

## 2011-08-23 VITALS — BP 132/84 | HR 68 | Temp 97.6°F | Resp 18 | Ht 63.0 in | Wt 230.2 lb

## 2011-08-23 DIAGNOSIS — Z124 Encounter for screening for malignant neoplasm of cervix: Secondary | ICD-10-CM

## 2011-08-23 DIAGNOSIS — Z01419 Encounter for gynecological examination (general) (routine) without abnormal findings: Secondary | ICD-10-CM | POA: Insufficient documentation

## 2011-08-23 DIAGNOSIS — R209 Unspecified disturbances of skin sensation: Secondary | ICD-10-CM

## 2011-08-23 DIAGNOSIS — L7682 Other postprocedural complications of skin and subcutaneous tissue: Secondary | ICD-10-CM | POA: Insufficient documentation

## 2011-08-23 NOTE — Progress Notes (Signed)
Chief complaint: Pain in the area of prior incisional hernia repair  History of present illness: This patient underwent repair of a twice recurrent incisional hernia on January 14, 2007. It was done with a large Ventralex mesh patch. For about the last year she's been having more pain just to the right side of the lower midline incision from the repair. She has not noticed a bulge. She has chronic problems with her bowels including constipation diarrhea. She came in for evaluation to see if she might have another hernia or if they could be done to help with her pain.  Past family history and review of systems are noted in the electronic medical record and not dictated this note. They have been reviewed.  Exam: Vital signs: General: The patient is alert oriented and healthy-appearing somewhat overweight, no acute distress.  Abdomen: Soft and benign. There is some tenderness just to the right of the midline along the lower half of her lower midline incision. The tissue here feels a little bit thickened suggesting perhaps scar or fat necrosis. There is no evidence of recurrent hernia. There is no hepatosplenomegaly. There is no guarding. There are no other hernias noted.  Data reviewed: I reviewed the old paper chart from her prior surgery as well as electronic medical record.  Impression: Incisional pain uncertain etiology but without recurrent hernia  Plan: I tolerated have surgical intervention at this time. But to recheck in three months just to be sure I hadn't missed a small hernia developing or that this area becomes worse.

## 2011-08-23 NOTE — Progress Notes (Signed)
  Subjective:     Samantha Norris is a 41 y.o. woman who comes in today for a  pap smear only.  Contraception: condoms  Objective:    There were no vitals taken for this visit. Pelvic Exam:  Pap smear obtained.   Assessment:    Screening pap smear.   Plan:    Follow up in one year, or as indicated by Pap results.  Provided educational materials re: HIV and women, BSE, nutrition, diet, exercise, PAP smear, heart health and self-esteem.  Pt given condoms

## 2011-08-23 NOTE — Patient Instructions (Signed)
Let me see you again in about three months to recheck the area

## 2011-08-23 NOTE — Patient Instructions (Signed)
  Your results will be ready in about a week.  I will mail them to you.  Thank you for coming to the Center for your care.  Chasitty Hehl 

## 2011-08-28 ENCOUNTER — Encounter: Payer: Self-pay | Admitting: *Deleted

## 2011-09-04 ENCOUNTER — Other Ambulatory Visit: Payer: Self-pay

## 2011-09-17 ENCOUNTER — Telehealth: Payer: Self-pay | Admitting: *Deleted

## 2011-09-17 NOTE — Telephone Encounter (Signed)
Called patient to remind of appt tomorrow with Dr. Daiva Eves.  She called back stating she wants to discuss with MD changing her BC from the patch to depo shot.  She wants to be started on this tomorrow, advised her to discuss this at visit. Wendall Mola CMA

## 2011-09-17 NOTE — Telephone Encounter (Signed)
Called patient to remind of appt tomorrow with Dr. Daiva Eves. Wendall Mola CMA

## 2011-09-18 ENCOUNTER — Ambulatory Visit (INDEPENDENT_AMBULATORY_CARE_PROVIDER_SITE_OTHER): Payer: Medicaid Other | Admitting: Infectious Disease

## 2011-09-18 ENCOUNTER — Encounter: Payer: Self-pay | Admitting: Infectious Disease

## 2011-09-18 VITALS — BP 132/86 | HR 88 | Temp 98.2°F | Ht 63.0 in | Wt 229.0 lb

## 2011-09-18 DIAGNOSIS — N920 Excessive and frequent menstruation with regular cycle: Secondary | ICD-10-CM

## 2011-09-18 DIAGNOSIS — B20 Human immunodeficiency virus [HIV] disease: Secondary | ICD-10-CM

## 2011-09-18 DIAGNOSIS — Z862 Personal history of diseases of the blood and blood-forming organs and certain disorders involving the immune mechanism: Secondary | ICD-10-CM

## 2011-09-18 DIAGNOSIS — Z23 Encounter for immunization: Secondary | ICD-10-CM

## 2011-09-18 NOTE — Assessment & Plan Note (Signed)
Continue Kaletra and Truvada patient to consider other regimens I described to her.

## 2011-09-18 NOTE — Progress Notes (Signed)
  Subjective:    Patient ID: Samantha Norris, female    DOB: Jan 04, 1971, 41 y.o.   MRN: [id number]  HPI  Samantha Norris is a 41 y.o. female who is doing superbly well on their antiviral regimen, with undetectable viral load and health cd4 count on [place] and truvada. We have discussed other antiretrovirals regimens that are more moderate including other boosted protease inhibitor such as Prezista Norvir and Truvada as well as possibility of single tablet regimen such as STRIBILD, she wishes to remain on Kaletra. She still is having some problems with menstrual spotting despite her topical contraceptive. She is going to consider having Depo-Provera injections but wants a "low dose. Otherwise she has no complaints Review of Systems  Constitutional: Negative for fever, chills, diaphoresis, activity change, appetite change, fatigue and unexpected weight change.  HENT: Negative for congestion, sore throat, rhinorrhea, sneezing, trouble swallowing and sinus pressure.   Eyes: Negative for photophobia and visual disturbance.  Respiratory: Negative for cough, chest tightness, shortness of breath, wheezing and stridor.   Cardiovascular: Negative for chest pain, palpitations and leg swelling.  Gastrointestinal: Negative for nausea, vomiting, abdominal pain, diarrhea, constipation, blood in stool, abdominal distention and anal bleeding.  Genitourinary: Positive for menstrual problem. Negative for dysuria, hematuria, flank pain and difficulty urinating.  Musculoskeletal: Negative for myalgias, back pain, joint swelling, arthralgias and gait problem.  Skin: Negative for color change, pallor, rash and wound.  Neurological: Negative for dizziness, tremors, weakness and light-headedness.  Hematological: Negative for adenopathy. Does not bruise/bleed easily.  Psychiatric/Behavioral: Negative for behavioral problems, confusion, sleep disturbance, dysphoric mood, decreased concentration and agitation.         Objective:   Physical Exam  Constitutional: She is oriented to person, place, and time. She appears well-developed and well-nourished. No distress.  HENT:  Head: Normocephalic and atraumatic.  Mouth/Throat: Oropharynx is clear and moist. No oropharyngeal exudate.  Eyes: Conjunctivae and EOM are normal. Pupils are equal, round, and reactive to light. No scleral icterus.  Neck: Normal range of motion. Neck supple. No JVD present.  Cardiovascular: Normal rate, regular rhythm and normal heart sounds.  Exam reveals no gallop and no friction rub.   No murmur heard. Pulmonary/Chest: Effort normal and breath sounds normal. No respiratory distress. She has no wheezes. She has no rales. She exhibits no tenderness.  Abdominal: She exhibits no distension and no mass. There is no tenderness. There is no rebound and no guarding.  Musculoskeletal: She exhibits no edema and no tenderness.  Lymphadenopathy:    She has no cervical adenopathy.  Neurological: She is alert and oriented to person, place, and time. She has normal reflexes. She exhibits normal muscle tone. Coordination normal.  Skin: Skin is warm and dry. She is not diaphoretic. No erythema. No pallor.  Psychiatric: She has a normal mood and affect. Her behavior is normal. Judgment and thought content normal.          Assessment & Plan:  HIV DISEASE Continue Kaletra and Truvada patient to consider other regimens I described to her.  IRON DEFICIENCY ANEMIA, HX OF Due to menstrual bleeding see below  EXCESSIVE MENSTRUAL BLEEDING Patient a visit with OB/GYN and consider Depo-Provera shots, he is desiring a "low dose"

## 2011-09-18 NOTE — Assessment & Plan Note (Signed)
Patient a visit with OB/GYN and consider Depo-Provera shots, he is desiring a "low dose"

## 2011-09-18 NOTE — Assessment & Plan Note (Signed)
Due to menstrual bleeding see below

## 2012-09-07 ENCOUNTER — Encounter: Payer: Self-pay | Admitting: *Deleted

## 2014-04-15 DIAGNOSIS — D509 Iron deficiency anemia, unspecified: Secondary | ICD-10-CM | POA: Insufficient documentation

## 2016-01-25 ENCOUNTER — Telehealth: Payer: Self-pay

## 2016-01-25 NOTE — Telephone Encounter (Signed)
Return call to patient. Message left on voice mail.   Laurell Josephsammy K King, RN

## 2016-01-25 NOTE — Telephone Encounter (Signed)
Patient has not been seen in a while and is ready to return. She has been "proudful" and did not want to be seen in the clinic. She does not want to sit in the waiting area.

## 2016-02-09 DIAGNOSIS — G510 Bell's palsy: Secondary | ICD-10-CM | POA: Insufficient documentation

## 2017-12-01 ENCOUNTER — Ambulatory Visit: Payer: Self-pay

## 2017-12-01 ENCOUNTER — Other Ambulatory Visit: Payer: Self-pay

## 2017-12-01 DIAGNOSIS — B20 Human immunodeficiency virus [HIV] disease: Secondary | ICD-10-CM

## 2017-12-02 LAB — COMPLETE METABOLIC PANEL WITH GFR
AG RATIO: 0.8 (calc) — AB (ref 1.0–2.5)
ALKALINE PHOSPHATASE (APISO): 31 U/L — AB (ref 33–115)
ALT: 8 U/L (ref 6–29)
AST: 54 U/L — AB (ref 10–35)
Albumin: 3.7 g/dL (ref 3.6–5.1)
BUN: 13 mg/dL (ref 7–25)
CO2: 27 mmol/L (ref 20–32)
CREATININE: 0.88 mg/dL (ref 0.50–1.10)
Calcium: 9 mg/dL (ref 8.6–10.2)
Chloride: 104 mmol/L (ref 98–110)
GFR, Est African American: 92 mL/min/{1.73_m2} (ref >=60)
GFR, Est Non African American: 79 mL/min/{1.73_m2} (ref >=60)
GLOBULIN: 4.5 g/dL — AB (ref 1.9–3.7)
Glucose, Bld: 81 mg/dL (ref 65–99)
Potassium: 3.8 mmol/L (ref 3.5–5.3)
SODIUM: 136 mmol/L (ref 135–146)
Total Bilirubin: 0.7 mg/dL (ref 0.2–1.2)
Total Protein: 8.2 g/dL — ABNORMAL HIGH (ref 6.1–8.1)

## 2017-12-02 LAB — CBC WITH DIFFERENTIAL/PLATELET
Basophils Absolute: 9 cells/uL (ref 0–200)
Basophils Relative: 0.3 %
Eosinophils Absolute: 139 cells/uL (ref 15–500)
Eosinophils Relative: 4.8 %
HEMATOCRIT: 33.5 % — AB (ref 35.0–45.0)
Hemoglobin: 11.1 g/dL — ABNORMAL LOW (ref 11.7–15.5)
Lymphs Abs: 679 cells/uL — ABNORMAL LOW (ref 850–3900)
MCH: 25.4 pg — ABNORMAL LOW (ref 27.0–33.0)
MCHC: 33.1 g/dL (ref 32.0–36.0)
MCV: 76.7 fL — ABNORMAL LOW (ref 80.0–100.0)
MONOS PCT: 7.2 %
NEUTROS PCT: 64.3 %
Neutro Abs: 1865 cells/uL (ref 1500–7800)
Platelets: 38 10*3/uL — ABNORMAL LOW (ref 140–400)
RBC: 4.37 10*6/uL (ref 3.80–5.10)
RDW: 14.5 % (ref 11.0–15.0)
TOTAL LYMPHOCYTE: 23.4 %
WBC: 2.9 10*3/uL — AB (ref 3.8–10.8)
WBCMIX: 209 {cells}/uL (ref 200–950)

## 2017-12-02 LAB — LIPID PANEL
CHOL/HDL RATIO: 4.9 (calc) (ref <5.0)
Cholesterol: 181 mg/dL (ref <200)
HDL: 37 mg/dL — ABNORMAL LOW (ref >50)
LDL CHOLESTEROL (CALC): 107 mg/dL — AB
NON-HDL CHOLESTEROL (CALC): 144 mg/dL — AB (ref <130)
TRIGLYCERIDES: 260 mg/dL — AB (ref <150)

## 2017-12-02 LAB — RPR: RPR Ser Ql: NONREACTIVE

## 2017-12-02 LAB — T-HELPER CELL (CD4) - (RCID CLINIC ONLY)
CD4 T CELL ABS: 80 /uL — AB (ref 400–2700)
CD4 T CELL HELPER: 10 % — AB (ref 33–55)

## 2017-12-03 ENCOUNTER — Other Ambulatory Visit: Payer: Self-pay | Admitting: *Deleted

## 2017-12-03 ENCOUNTER — Other Ambulatory Visit: Payer: Self-pay | Admitting: Infectious Diseases

## 2017-12-03 ENCOUNTER — Encounter: Payer: Self-pay | Admitting: Infectious Diseases

## 2017-12-03 DIAGNOSIS — B20 Human immunodeficiency virus [HIV] disease: Secondary | ICD-10-CM

## 2017-12-03 LAB — HIV-1 RNA QUANT-NO REFLEX-BLD
HIV 1 RNA Quant: 168000 copies/mL — ABNORMAL HIGH
HIV-1 RNA Quant, Log: 5.23 Log copies/mL — ABNORMAL HIGH

## 2017-12-03 MED ORDER — DARUN-COBIC-EMTRICIT-TENOFAF 800-150-200-10 MG PO TABS: 1.0000 | ORAL_TABLET | Freq: Every day | ORAL | 2 refills | Status: DC

## 2017-12-03 MED ORDER — SULFAMETHOXAZOLE-TRIMETHOPRIM 800-160 MG PO TABS: 1.0000 | ORAL_TABLET | Freq: Every day | ORAL | 4 refills | Status: DC

## 2017-12-03 NOTE — Progress Notes (Signed)
Reprint of Bactrim for emergent ADAP Andree CossHowell, An Lannan M, RN

## 2017-12-03 NOTE — Progress Notes (Signed)
Perfect! Do you need a bottle of SYMTUZA I have 3 in my office?

## 2017-12-03 NOTE — Progress Notes (Signed)
Rx sent for Bactrim 1 DS tab daily. If she has thrush OK to send in rx for diflucan 200 mg QD x 7d. Assuming she starts on medications right away will forego Azithromycin with CD4 of 80 for now.   Looking forward to meeting with her.

## 2017-12-03 NOTE — Progress Notes (Signed)
Emergency adap application per Dr Zenaida NieceVan Dam's request.  His note indicates he wants her to be on Symtuza.  Prescription written for symtuza, called into [facility]. Please advise on PCP/OI prophylaxis. Andree CossHowell, Evelynn Hench M, RN

## 2017-12-08 ENCOUNTER — Other Ambulatory Visit: Payer: Self-pay | Admitting: *Deleted

## 2017-12-08 DIAGNOSIS — B20 Human immunodeficiency virus [HIV] disease: Secondary | ICD-10-CM

## 2017-12-08 MED ORDER — SULFAMETHOXAZOLE-TRIMETHOPRIM 800-160 MG PO TABS: 1.0000 | ORAL_TABLET | Freq: Every day | ORAL | 1 refills | Status: DC

## 2017-12-08 MED ORDER — DARUN-COBIC-EMTRICIT-TENOFAF 800-150-200-10 MG PO TABS: 1.0000 | ORAL_TABLET | Freq: Every day | ORAL | 1 refills | Status: DC

## 2017-12-22 ENCOUNTER — Encounter: Payer: Self-pay | Admitting: Infectious Diseases

## 2017-12-22 ENCOUNTER — Ambulatory Visit (INDEPENDENT_AMBULATORY_CARE_PROVIDER_SITE_OTHER): Payer: Self-pay | Admitting: Infectious Diseases

## 2017-12-22 ENCOUNTER — Ambulatory Visit (INDEPENDENT_AMBULATORY_CARE_PROVIDER_SITE_OTHER): Payer: Self-pay | Admitting: Pharmacist

## 2017-12-22 DIAGNOSIS — Z0000 Encounter for general adult medical examination without abnormal findings: Secondary | ICD-10-CM

## 2017-12-22 DIAGNOSIS — D696 Thrombocytopenia, unspecified: Secondary | ICD-10-CM

## 2017-12-22 DIAGNOSIS — B20 Human immunodeficiency virus [HIV] disease: Secondary | ICD-10-CM

## 2017-12-22 DIAGNOSIS — N920 Excessive and frequent menstruation with regular cycle: Secondary | ICD-10-CM

## 2017-12-22 DIAGNOSIS — Z862 Personal history of diseases of the blood and blood-forming organs and certain disorders involving the immune mechanism: Secondary | ICD-10-CM

## 2017-12-22 DIAGNOSIS — H9192 Unspecified hearing loss, left ear: Secondary | ICD-10-CM

## 2017-12-22 MED ORDER — SULFAMETHOXAZOLE-TRIMETHOPRIM 800-160 MG PO TABS: 1.0000 | ORAL_TABLET | Freq: Every day | ORAL | 3 refills | Status: DC

## 2017-12-22 MED ORDER — BICTEGRAVIR-EMTRICITAB-TENOFOV 50-200-25 MG PO TABS: 1.0000 | ORAL_TABLET | Freq: Every day | ORAL | 5 refills | Status: DC

## 2017-12-22 NOTE — Assessment & Plan Note (Signed)
TM's are scarred. Difficult to tell if there is retraction but I feel that this may be the case. No obvious signs of infection present today. Recommend follow up with her ENT. Can place referral if needed.

## 2017-12-22 NOTE — Progress Notes (Signed)
Name: Samantha Norris  DOB: 01/31/1971 MRN: [id number] PCP: Patient, No Pcp Per   Patient Active Problem List   Diagnosis Date Noted  . 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 Thrombocytopenia (HCC) 12/22/2017  . health maintenance  12/22/2017  . Menorrhagia 08/21/2009  . IRON DEFICIENCY ANEMIA, HX OF 08/21/2009  . AIDS (acquired immune deficiency syndrome) (HCC) 10/14/2006     Brief Narrative:  Samantha Norris is a 47 y.o. female with HIV disease. HIV Risk: heterosexual. History of OIs: none  Previous Regimens: . Prezista +Norvir + Truvada (pregnancy)  . Kaletra BID + Truvada, 2013 --> suppressed   Genotypes: . 2013 no significant mutations.   Subjective:   Chief Complaint  Patient presents with  . New Patient (Initial Visit)    getting back in care for HIV     Samantha Norris is here today to re-establish care for her HIV infection. She has been off all medications for 5 years now. Previously saw Dr. Daiva Eves in 2013 and was taking Kaletra and Truvada. 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 Used to work a lot with [employer] and really enjoyed helping them without disclosing her status to them. She has 2 children and is married.  Reports no concerns today suggestive of associated opportunistic infection or advancing HIV disease such as fevers, night sweats, weight loss, anorexia, cough, SOB, nausea, vomiting, diarrhea, headache, sensory changes, lymphadenopathy or oral thrush.    She does not have other health problems outside of heavy menstrual cycles and iron deficiency anemia. She continues taking iron daily. She continues to struggle with heavy menses but it is not necessarily prolonged. Heavy  bleeding without clots usually lasts about 3-4 days then thins out and stops. Feels like the duration of her cycle is normal. Previously was on birth control for this.   She does not have a PCP that she is working with currently. Working with an ENT as she has a history of hearing loss and occasional ear infections. Feels like she has fluid/congestion in her ears today L>R. Needs a pap smear and has never had a mammogram. Medical/family and social history updated today.   Review of Systems  Constitutional: Negative for chills and fever.  HENT: Negative for tinnitus.   Eyes: Negative for blurred vision and photophobia.  Respiratory: Negative for cough and sputum production.   Cardiovascular: Negative for chest pain.  Gastrointestinal: Negative for diarrhea, nausea and vomiting.  Genitourinary: Negative for dysuria.       Heavy menses   Musculoskeletal: Negative.   Skin: Negative for rash.  Neurological: Negative for headaches.  Psychiatric/Behavioral: Negative for depression. The patient is not nervous/anxious.     Past Medical History:  Diagnosis Date  . Abdominal pain   . Anemia   . Blood transfusion   . Constipation   . HIV infection [facility])     Outpatient Medications Prior to Visit  Medication Sig Dispense Refill  . Ferrous Sulfate (IRON SUPPLEMENT PO) Take by mouth daily.    . Darunavir-Cobicisctat-Emtricitabine-Tenofovir Alafenamide (SYMTUZA) 800-150-200-10 MG TABS Take 1 tablet by mouth daily with breakfast. (Patient not taking: Reported on 12/22/2017) 30 tablet 1  .  norelgestromin-ethinyl estradiol (ORTHO EVRA) 150-20 MCG/24HR Place 1 patch onto the skin. Once weekly for three weeks then off a week     . sulfamethoxazole-trimethoprim (BACTRIM DS,SEPTRA DS) 800-160 MG tablet Take 1 tablet by mouth daily. (Patient not taking: Reported on 12/22/2017) 30 tablet 1   No facility-administered medications prior to visit.      No Known Allergies  Social History   Tobacco Use  .  Smoking status: Never Smoker  . Smokeless tobacco: Never Used  Substance Use Topics  . Alcohol use: No  . Drug use: No    Family History  Problem Relation Age of Onset  . Heart disease Father        heart failure    Social History   Substance and Sexual Activity  Sexual Activity Not on file     Objective:   Vitals:   12/22/17 0922  BP: (!) 160/96  Pulse: 80  Temp: 98 F (36.7 C)  TempSrc: Oral  Weight: 218 lb (98.9 kg)   Body mass index is 38.62 kg/m.  Physical Exam  Constitutional: She is oriented to person, place, and time. She appears well-developed and well-nourished.  Pleasant woman. Seated comfortably in chair. Well dressed.   HENT:  Right Ear: No drainage, swelling or tenderness. Tympanic membrane is scarred. Tympanic membrane is not perforated and not erythematous. No middle ear effusion.  Left Ear: No drainage, swelling or tenderness. Tympanic membrane is scarred. Tympanic membrane is not erythematous.  No middle ear effusion. Decreased hearing is noted.  Mouth/Throat: Oropharynx is clear and moist. No oral lesions. No dental abscesses.  Eyes: Pupils are equal, round, and reactive to light.  Cardiovascular: Normal rate, regular rhythm and normal heart sounds.  Pulmonary/Chest: Effort normal and breath sounds normal.  Abdominal: Soft. She exhibits no distension. There is no tenderness.  Musculoskeletal: Normal range of motion. She exhibits no tenderness.  Lymphadenopathy:    She has cervical adenopathy.       Left cervical: Superficial cervical adenopathy present.  Neurological: She is alert and oriented to person, place, and time.  Skin: Skin is warm and dry. No rash noted.  Psychiatric: Judgment normal.  Vitals reviewed.   Lab Results Lab Results  Component Value Date   WBC 2.9 (L) 12/01/2017   HGB 11.1 (L) 12/01/2017   HCT 33.5 (L) 12/01/2017   MCV 76.7 (L) 12/01/2017   PLT 38 (L) 12/01/2017    Lab Results  Component Value Date   CREATININE  0.88 12/01/2017   BUN 13 12/01/2017   NA 136 12/01/2017   K 3.8 12/01/2017   CL 104 12/01/2017   CO2 27 12/01/2017    Lab Results  Component Value Date   ALT 8 12/01/2017   AST 54 (H) 12/01/2017   ALKPHOS 55 07/22/2011   BILITOT 0.7 12/01/2017    Lab Results  Component Value Date   CHOL 181 12/01/2017   HDL 37 (L) 12/01/2017   LDLCALC 107 (H) 12/01/2017   TRIG 260 (H) 12/01/2017   CHOLHDL 4.9 12/01/2017   HIV 1 RNA Quant (copies/mL)  Date Value  12/01/2017 168,000 (H)  07/22/2011 <20  07/03/2010 54 (H)   CD4 T Cell Abs  Date Value  12/01/2017 80 /uL (L)  07/22/2011 380 cmm (L)  07/03/2010 460 cmm     Assessment & Plan:   Problem List Items Addressed This Visit      Other   Thrombocytopenia (HCC)    Lab Results  Component Value Date  PLT 38 (L) 12/01/2017   Normal in 2013 with controlled HIV. Would suspect this is due to untreated advanced HIV/AIDS. She has noticed prolonged bleeding but nothing severe. No bruising. Discussed possible need for hematology evaluation for assistance with treatment for this (IVIG vs prednisone). She is very reluctant to do this as she does not have health insurance. Discussed bleeding precautions and low threshold for ED evaluation in the future. Will recheck in 6 weeks; if continues to drop will place hematology referral.       Menorrhagia    With concurrent thrombocytopenia I discussed with Syrai today my recommendation to consider re-initiating contraception with Mirena IUD or resuming OCPs to allow for only quarterly cycles. She will consider this and we can discuss at upcoming appointments.       IRON DEFICIENCY ANEMIA, HX OF    Lab Results  Component Value Date   HGB 11.1 (L) 12/01/2017   Still with mild anemia on recent CBC. She has a history of menorrhagia. Continues taking her Fe supplement once daily. Will at next lab draw check iron stores to see if she needs to increase to BID.       Relevant Medications   Ferrous  Sulfate (IRON SUPPLEMENT PO)   health maintenance     Pap smear at upcoming appointment. Will discuss SBE and recommendations for mammography. Will need scholarship assistance after October when funds available.       Decreased hearing of left ear    TM's are scarred. Difficult to tell if there is retraction but I feel that this may be the case. No obvious signs of infection present today. Recommend follow up with her ENT. Can place referral if needed.       AIDS (acquired immune deficiency syndrome) (HCC)    Returning to care after 5 years. From what I can tell she has only previously been on Kaletra + Truvada. She feels that pill size and # of pills taken are potential barriers to her -- we will start her on Biktarvy once daily as she has never been on INSTI. No genotype was run on recent sample but previously no mutations detected that would prohibit use. Counseled on proper use and separating her Fe supplement and other MVIs by at least 4 hours before/after taking Biktarvy. Discussed CD4 count today--> will restart her on Bactrim once daily.  She will return to see me in 6 weeks.       Relevant Medications   sulfamethoxazole-trimethoprim (BACTRIM DS,SEPTRA DS) 800-160 MG tablet   bictegravir-emtricitabine-tenofovir AF (BIKTARVY) 50-200-25 MG TABS tablet      Meds ordered this encounter  Medications  . sulfamethoxazole-trimethoprim (BACTRIM DS,SEPTRA DS) 800-160 MG tablet    Sig: Take 1 tablet by mouth daily.    Dispense:  30 tablet    Refill:  3    Patient would like to have medications mailed to her @ [address] [address] [place], [place] [address]    Order Specific Question:   Supervising Provider    Answer:   HATCHER, JEFFREY C [[id number]]  . bictegravir-emtricitabine-tenofovir AF (BIKTARVY) 50-200-25 MG TABS tablet    Sig: Take 1 tablet by mouth daily. Try to take at the same time each day with or without food.    Dispense:  30 tablet    Refill:  5    Patient would like to have medications  mailed to her @ [address] [address] [place], [place] [address]    Order Specific Question:   Supervising Provider  Answer:   HATCHER, JEFFREY C [[id number]]   Return in about 6 weeks (around 02/02/2018).  Rexene Alberts, MSN, NP-C [facility] Pager: [phone] Office: [phone]  12/22/17  10:33 PM

## 2017-12-22 NOTE — Assessment & Plan Note (Signed)
Lab Results  Component Value Date   HGB 11.1 (L) 12/01/2017   Still with mild anemia on recent CBC. She has a history of menorrhagia. Continues taking her Fe supplement once daily. Will at next lab draw check iron stores to see if she needs to increase to BID.

## 2017-12-22 NOTE — Assessment & Plan Note (Signed)
With concurrent thrombocytopenia I discussed with Samantha Norris today my recommendation to consider re-initiating contraception with Mirena IUD or resuming OCPs to allow for only quarterly cycles. She will consider this and we can discuss at upcoming appointments.

## 2017-12-22 NOTE — Progress Notes (Signed)
HPI: Samantha Norris is a 47 y.o. female who presents to the [facility] clinic today to follow-up for her HIV infection after being out of care for 5 years.  Patient Active Problem List   Diagnosis Date Noted  . Incisional pain 08/23/2011  . 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 EXCESSIVE MENSTRUAL BLEEDING 08/21/2009  . IRON DEFICIENCY ANEMIA, HX OF 08/21/2009  . HIV DISEASE 10/14/2006    Patient's Medications  New Prescriptions   No medications on file  Previous Medications   BICTEGRAVIR-EMTRICITABINE-TENOFOVIR AF (BIKTARVY) 50-200-25 MG TABS TABLET    Take 1 tablet by mouth daily. Try to take at the same time each day with or without food.   SULFAMETHOXAZOLE-TRIMETHOPRIM (BACTRIM DS,SEPTRA DS) 800-160 MG TABLET    Take 1 tablet by mouth daily.  Modified Medications   No medications on file  Discontinued Medications   No medications on file    Allergies: No Known Allergies  Past Medical History: Past Medical History:  Diagnosis Date  . Abdominal pain   . Anemia   . Blood transfusion   . Constipation   . HIV infection [facility])     Social History: Social History   Socioeconomic History  . Marital status: Single    Spouse name: Not on file  . Number of children: Not on file  . Years of education: Not on file  . Highest education level: Not on file  Occupational History  . Not on file  Social Needs  . Financial resource strain: Not on file  . Food insecurity:    Worry: Not on file    Inability: Not on file  . Transportation needs:    Medical: Not on file    Non-medical: Not on file  Tobacco Use  . Smoking status: Never Smoker  . Smokeless tobacco: Never Used  Substance and Sexual Activity  . Alcohol use: No  . Drug use: No  . Sexual activity: Not on file  Lifestyle  . Physical activity:    Days per week: Not on file    Minutes per session: Not on file  . Stress: Not on file  Relationships  . Social connections:    Talks on phone: Not on file    Gets together:  Not on file    Attends religious service: Not on file    Active member of club or organization: Not on file    Attends meetings of clubs or organizations: Not on file    Relationship status: Not on file  Other Topics Concern  . Not on file  Social History Narrative  . Not on file    Labs: Lab Results  Component Value Date   HIV1RNAQUANT 168,000 (H) 12/01/2017   HIV1RNAQUANT <20 07/22/2011   HIV1RNAQUANT 54 (H) 07/03/2010   CD4TABS 80 (L) 12/01/2017   CD4TABS 380 (L) 07/22/2011   CD4TABS 460 07/03/2010    RPR and STI Lab Results  Component Value Date   LABRPR NON-REACTIVE 12/01/2017   LABRPR NON REAC 07/22/2011   LABRPR NON REAC 07/03/2010   LABRPR NON REAC 07/24/2009   LABRPR NON REAC 05/06/2008    No flowsheet data found.  Hepatitis B Lab Results  Component Value Date   HEPBSAB NO 06/23/2006   HEPBSAG NO 06/23/2006   Hepatitis C No results found for: HEPCAB, HCVRNAPCRQN Hepatitis A No results found for: HAV Lipids: Lab Results  Component Value Date   CHOL 181 12/01/2017   TRIG 260 (H) 12/01/2017   HDL 37 (L) 12/01/2017  CHOLHDL 4.9 12/01/2017   VLDL 54 (H) 07/22/2011   LDLCALC 107 (H) 12/01/2017    Current HIV Regimen: None  Assessment: Samantha Norris is here today to see [name] for her HIV infection after being out of care for around 5 years.  Samantha Norris was last seen by Dr. Daiva EvesVan Dam in 2013 and was on Truvada + Kaletra at that time. Samantha Norris states Samantha Norris has just had a psychological barrier to taking he medications and is ready to start back. Samantha Norris requests a small pill, so [name] will start Biktarvy for her today.  Samantha Norris likes that it looks like an ibuprofen. Her ADAP is approved so [facility] and have it mailed for the future.  I explained how to take Biktarvy and the possible side effects associated with taking it.  Encouraged compliance and explained resistance  I gave her my card and told her to call me with any issues.  Plan: - Start  Biktarvy PO once daily  Cassie L. Kuppelweiser, PharmD, AAHIVP, CPP Infectious Diseases Clinical Pharmacist [facility] 12/22/2017, 4:51 PM

## 2017-12-22 NOTE — Patient Instructions (Addendum)
Wonderful to meet you!  Will need to start you on 2 medications - Bactrim and Biktarvy. Please take your biktarvy once a day with food at least 4-6 hours before or after your iron.   For your nasal congestion - would recommend claritin or zyrtec or allegra once daily.   Your platelets are very low (38,000) - please if you are experiencing bleeding that will not stop would have you go to the Emergency Room. If they continue to drop will need to get you in with Hematology to help manage this for you.   Would like to see you back in 6 weeks to check in again. Will do your pap smear at this appointment.

## 2017-12-22 NOTE — Assessment & Plan Note (Signed)
Lab Results  Component Value Date   PLT 38 (L) 12/01/2017   Normal in 2013 with controlled HIV. Would suspect this is due to untreated advanced HIV/AIDS. She has noticed prolonged bleeding but nothing severe. No bruising. Discussed possible need for hematology evaluation for assistance with treatment for this (IVIG vs prednisone). She is very reluctant to do this as she does not have health insurance. Discussed bleeding precautions and low threshold for ED evaluation in the future. Will recheck in 6 weeks; if continues to drop will place hematology referral.

## 2017-12-22 NOTE — Assessment & Plan Note (Signed)
Pap smear at upcoming appointment. Will discuss SBE and recommendations for mammography. Will need scholarship assistance after October when funds available.

## 2017-12-22 NOTE — Assessment & Plan Note (Addendum)
Returning to care after 5 years. From what I can tell she has only previously been on Kaletra + Truvada. She feels that pill size and # of pills taken are potential barriers to her -- we will start her on Biktarvy once daily as she has never been on INSTI. No genotype was run on recent sample but previously no mutations detected that would prohibit use. Counseled on proper use and separating her Fe supplement and other MVIs by at least 4 hours before/after taking Biktarvy. Discussed CD4 count today--> will restart her on Bactrim once daily.  She will return to see me in 6 weeks.

## 2018-02-10 ENCOUNTER — Ambulatory Visit (INDEPENDENT_AMBULATORY_CARE_PROVIDER_SITE_OTHER): Payer: Self-pay | Admitting: Infectious Diseases

## 2018-02-10 ENCOUNTER — Encounter: Payer: Self-pay | Admitting: Infectious Diseases

## 2018-02-10 VITALS — BP 129/82 | HR 60 | Temp 98.8°F | Wt 222.0 lb

## 2018-02-10 DIAGNOSIS — Z0000 Encounter for general adult medical examination without abnormal findings: Secondary | ICD-10-CM

## 2018-02-10 DIAGNOSIS — Z862 Personal history of diseases of the blood and blood-forming organs and certain disorders involving the immune mechanism: Secondary | ICD-10-CM

## 2018-02-10 DIAGNOSIS — D696 Thrombocytopenia, unspecified: Secondary | ICD-10-CM

## 2018-02-10 DIAGNOSIS — B20 Human immunodeficiency virus [HIV] disease: Secondary | ICD-10-CM

## 2018-02-10 DIAGNOSIS — Z23 Encounter for immunization: Secondary | ICD-10-CM

## 2018-02-10 DIAGNOSIS — Z124 Encounter for screening for malignant neoplasm of cervix: Secondary | ICD-10-CM

## 2018-02-10 NOTE — Patient Instructions (Addendum)
Colace , Senna, Miralax are all safe to take with your medications.  If you choose to use Milk of Mag - separate by 4 hours.  Will check your labs today and call you with results including your pap smear.  Continue taking your bactrim and biktarvy once a day.   Constipation, Adult Constipation is when a person:  Poops (has a bowel movement) fewer times in a week than normal.  Has a hard time pooping.  Has poop that is dry, hard, or bigger than normal.  Follow these instructions at home: Eating and drinking   Eat foods that have a lot of fiber, such as: ? Fresh fruits and vegetables. ? Whole grains. ? Beans.  Eat less of foods that are high in fat, low in fiber, or overly processed, such as: ? [place] fries. ? Hamburgers. ? Cookies. ? Candy. ? Soda.  Drink enough fluid to keep your pee (urine) clear or pale yellow. General instructions  Exercise regularly or as told by your doctor.  Go to the restroom when you feel like you need to poop. Do not hold it in.  Take over-the-counter and prescription medicines only as told by your doctor. These include any fiber supplements.  Do pelvic floor retraining exercises, such as: ? Doing deep breathing while relaxing your lower belly (abdomen). ? Relaxing your pelvic floor while pooping.  Watch your condition for any changes.  Keep all follow-up visits as told by your doctor. This is important. Contact a doctor if:  You have pain that gets worse.  You have a fever.  You have not pooped for 4 days.  You throw up (vomit).  You are not hungry.  You lose weight.  You are bleeding from the anus.  You have thin, pencil-like poop (stool). Get help right away if:  You have a fever, and your symptoms suddenly get worse.  You leak poop or have blood in your poop.  Your belly feels hard or bigger than normal (is bloated).  You have very bad belly pain.  You feel dizzy or you faint. This information is not intended to  replace advice given to you by your health care provider. Make sure you discuss any questions you have with your health care provider. Document Released: 10/02/2007 Document Revised: 11/03/2015 Document Reviewed: 10/04/2015 Elsevier Interactive Patient Education  2018 [employer].

## 2018-02-10 NOTE — Progress Notes (Signed)
Name: Samantha Norris  DOB: 1971/01/02 MRN: [id number] PCP: Patient, No Pcp Per   Patient Active Problem List   Diagnosis Date Noted  . 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 Thrombocytopenia (HCC) 12/22/2017  . health maintenance  12/22/2017  . Menorrhagia 08/21/2009  . IRON DEFICIENCY ANEMIA, HX OF 08/21/2009  . AIDS (acquired immune deficiency syndrome) (HCC) 10/14/2006     Brief Narrative:  Samantha Norris is a 47 y.o. female with HIV disease. HIV Risk: heterosexual. History of OIs: none  Previous Regimens: . Prezista +Norvir + Truvada (pregnancy)  . Kaletra BID + Truvada, 2013 --> suppressed   Genotypes: . 2013 no significant mutations.   Subjective:   Chief Complaint  Patient presents with  . Follow-up    Samantha Norris is here today for follow up on her HIV/AIDS and thrombocytopenia.   She says she is doing well since taking Biktarvy and reports excellent adherence. Estimates no missed doses. She has had no side effects and is also taking her bactrim prophylaxis as ordered. She has not noticed any new symptoms develop since she resumed medications and specifically denies any fevers, night sweats, weight loss, anorexia, cough, SOB, nausea, vomiting, diarrhea, headache, sensory changes, lymphadenopathy or oral thrush.   She resumed taking iron supplements and estimates a dose maybe 2-3 times a week. She has noticed some bruises that she feels are larger and out of proportion to the injury/trauma that causes them.   She has started a new job since our last visit and still in orientation. Working as a Theatre manager. She is asking for suggestions to help with constipation - notices straining and hard/firm BMs. She does not have any bleeding noted in the toilet but some periodically when she wipes. She does not drink much water or produce. She is specifically asking if she can take milk of magnesia.   Review of Systems  Constitutional: Negative for chills and  fever.  HENT: Negative for tinnitus.   Eyes: Negative for blurred vision and photophobia.  Respiratory: Negative for cough and sputum production.   Cardiovascular: Negative for chest pain.  Gastrointestinal: Negative for diarrhea, nausea and vomiting.  Genitourinary: Negative for dysuria.       Heavy menses   Musculoskeletal: Negative.   Skin: Negative for rash.  Neurological: Negative for headaches.  Psychiatric/Behavioral: Negative for depression. The patient is not nervous/anxious.     Past Medical History:  Diagnosis Date  . Abdominal pain   . Anemia   . Blood transfusion   . Constipation   . HIV infection [facility])     Outpatient Medications Prior to Visit  Medication Sig Dispense Refill  . bictegravir-emtricitabine-tenofovir AF (BIKTARVY) 50-200-25 MG TABS tablet Take 1 tablet by mouth daily. Try to take at the same time each day with or without food. 30 tablet 5  . Ferrous Sulfate (IRON SUPPLEMENT PO) Take by mouth daily.    Marland Kitchen sulfamethoxazole-trimethoprim (BACTRIM DS,SEPTRA DS) 800-160 MG tablet Take 1 tablet by mouth daily. 30 tablet 3   No facility-administered medications prior to visit.      No Known Allergies  Social History   Tobacco Use  . Smoking status: Never Smoker  . Smokeless tobacco: Never Used  Substance Use Topics  . Alcohol use: No  . Drug use: No    Social History   Substance and Sexual Activity  Sexual Activity Not on file     Objective:   Vitals:   02/10/18 1036  BP: 129/82  Pulse: 60  Temp: 98.8 F (37.1 C)  TempSrc: Oral  Weight: 222 lb (100.7 kg)   Body mass index is 39.33 kg/m.  Physical Exam  Constitutional: She is oriented to person, place, and time. She appears well-developed and well-nourished. No distress.  HENT:  Mouth/Throat: Oropharynx is clear and moist. No oral lesions. No dental abscesses.  Cardiovascular: Normal rate, regular rhythm and normal heart sounds.  Pulmonary/Chest: Effort normal and breath sounds  normal.  Abdominal: Soft. She exhibits no distension. There is no tenderness.  Musculoskeletal: Normal range of motion. She exhibits no tenderness.  Lymphadenopathy:    She has no cervical adenopathy.  Neurological: She is alert and oriented to person, place, and time.  Skin: Skin is warm and dry. No rash noted.  Psychiatric: She has a normal mood and affect. Judgment normal.    Lab Results Lab Results  Component Value Date   WBC 3.4 (L) 02/10/2018   HGB 11.6 (L) 02/10/2018   HCT 35.4 02/10/2018   MCV 78.5 (L) 02/10/2018   PLT 55 (L) 02/10/2018    Lab Results  Component Value Date   CREATININE 0.88 12/01/2017   BUN 13 12/01/2017   NA 136 12/01/2017   K 3.8 12/01/2017   CL 104 12/01/2017   CO2 27 12/01/2017    Lab Results  Component Value Date   ALT 8 12/01/2017   AST 54 (H) 12/01/2017   ALKPHOS 55 07/22/2011   BILITOT 0.7 12/01/2017    Lab Results  Component Value Date   CHOL 181 12/01/2017   HDL 37 (L) 12/01/2017   LDLCALC 107 (H) 12/01/2017   TRIG 260 (H) 12/01/2017   CHOLHDL 4.9 12/01/2017   HIV 1 RNA Quant (copies/mL)  Date Value  02/10/2018 <20 DETECTED (A)  12/01/2017 168,000 (H)  07/22/2011 <20   CD4 T Cell Abs  Date Value  02/10/2018 110 /uL (L)  12/01/2017 80 /uL (L)  07/22/2011 380 cmm (L)     Assessment & Plan:   Problem List Items Addressed This Visit      Unprioritized   AIDS (acquired immune deficiency syndrome) (HCC) - Primary    Reports good adherence and tolerance to Biktarvy. Will check VL and repeat CD4 today. She will continue her Biktarvy and Bactrim. She is still quite fearful about being "outed" and I do worry she may find this to be a reason to stop medications again in the future. I acknowledged that there is a lot of stigma surrounding HIV and uniquely so with women. I encouraged her that the best way to keep this private is to take her medications regularly and consistently so she stays healthy. She agrees.  Will have her  return in 2 months to check in and repeat labs.        Relevant Orders   HIV-1 RNA quant-no reflex-bld (Completed)   T-helper cell (CD4)- ([facility] clinic only) (Completed)   health maintenance     Flu vaccine today. Pap smear completed today.  She will need referral for mammography and scholarship assistance.       IRON DEFICIENCY ANEMIA, HX OF    Continue iron supplementation as she is currently taking. Check hgb on CBC. Counseled on proper timing and separation of this and her Biktarvy by 4 hours.       Thrombocytopenia (HCC)    Will repeat plt count today. Again discussed need to consider hematology evaluation should this not respond with resuming HAART. Long-standing untreated HIV likely  contributing at least partially.       Relevant Orders   CBC (Completed)    Other Visit Diagnoses    Screening for cervical cancer       Relevant Orders   Cytology - PAP   Need for immunization against influenza       Relevant Orders   Flu Vaccine QUAD 36+ mos IM (Completed)      No orders of the defined types were placed in this encounter.  Return in about 2 months (around 04/12/2018).  Rexene Alberts, MSN, NP-C [facility] Pager: [phone] Office: [phone]  02/12/18  10:44 PM

## 2018-02-11 LAB — T-HELPER CELL (CD4) - (RCID CLINIC ONLY)
CD4 T CELL HELPER: 13 % — AB (ref 33–55)
CD4 T Cell Abs: 110 /uL — ABNORMAL LOW (ref 400–2700)

## 2018-02-11 NOTE — Progress Notes (Signed)
Platelets are improving and up to 55K now with resuming ART. She declined hematology evaluation and would like to see how this responds over time. Counseled for monitoring of bleeding and low threshold for evaluation of this.

## 2018-02-12 LAB — CBC
HCT: 35.4 % (ref 35.0–45.0)
Hemoglobin: 11.6 g/dL — ABNORMAL LOW (ref 11.7–15.5)
MCH: 25.7 pg — AB (ref 27.0–33.0)
MCHC: 32.8 g/dL (ref 32.0–36.0)
MCV: 78.5 fL — ABNORMAL LOW (ref 80.0–100.0)
Platelets: 55 10*3/uL — ABNORMAL LOW (ref 140–400)
RBC: 4.51 10*6/uL (ref 3.80–5.10)
RDW: 14.1 % (ref 11.0–15.0)
WBC: 3.4 10*3/uL — ABNORMAL LOW (ref 3.8–10.8)

## 2018-02-12 LAB — HIV-1 RNA QUANT-NO REFLEX-BLD
HIV 1 RNA QUANT: DETECTED {copies}/mL — AB
HIV-1 RNA Quant, Log: <1.3 Log copies/mL — AB

## 2018-02-12 NOTE — Assessment & Plan Note (Signed)
Flu vaccine today. Pap smear completed today.  She will need referral for mammography and scholarship assistance.

## 2018-02-12 NOTE — Assessment & Plan Note (Signed)
Continue iron supplementation as she is currently taking. Check hgb on CBC. Counseled on proper timing and separation of this and her Biktarvy by 4 hours.

## 2018-02-12 NOTE — Assessment & Plan Note (Addendum)
Reports good adherence and tolerance to Biktarvy. Will check VL and repeat CD4 today. She will continue her Biktarvy and Bactrim. She is still quite fearful about being "outed" and I do worry she may find this to be a reason to stop medications again in the future. I acknowledged that there is a lot of stigma surrounding HIV and uniquely so with women. I encouraged her that the best way to keep this private is to take her medications regularly and consistently so she stays healthy. She agrees.  Will have her return in 2 months to check in and repeat labs.

## 2018-02-12 NOTE — Assessment & Plan Note (Signed)
Will repeat plt count today. Again discussed need to consider hematology evaluation should this not respond with resuming HAART. Long-standing untreated HIV likely contributing at least partially.

## 2018-02-13 ENCOUNTER — Telehealth: Payer: Self-pay

## 2018-02-13 NOTE — Telephone Encounter (Addendum)
Left generic VM to call office back to make her aware of PAP smear results.   ----- Message from Blanchard Kelch, NP sent at 02/13/2018  9:26 AM EDT ----- Please call Samantha Norris to let her know her medications are working perfectly - her viral load is undetectable. Her platelet count is also a a bit better but still low. Will check this again at the next lab draw. No urgent need for hematology referral for now.

## 2018-02-16 LAB — CYTOLOGY - PAP
Chlamydia: NEGATIVE
DIAGNOSIS: UNDETERMINED — AB
HPV: NOT DETECTED
NEISSERIA GONORRHEA: NEGATIVE

## 2018-02-16 NOTE — Progress Notes (Signed)
ASCUS w/o HPV on pap smear - repeat in 6 months per ASCCP guidelines. I have called and discussed with the patient and will repeat in April 2020.  Also informed her of her lab results including undetectable viral load, current CD4 count and slight improvement in her platelet counts. She will continue all medications and will defer hematology evaluation as long as she shows response with ART and no symptom. She is very happy to hear these results and looking forward to keeping things under better control.

## 2018-04-07 ENCOUNTER — Ambulatory Visit: Payer: Self-pay | Admitting: Infectious Diseases

## 2018-04-14 ENCOUNTER — Ambulatory Visit: Payer: Self-pay | Admitting: Infectious Diseases

## 2018-06-16 ENCOUNTER — Other Ambulatory Visit: Payer: Self-pay | Admitting: Infectious Diseases

## 2018-06-16 DIAGNOSIS — B20 Human immunodeficiency virus [HIV] disease: Secondary | ICD-10-CM

## 2018-10-04 ENCOUNTER — Other Ambulatory Visit: Payer: Self-pay | Admitting: Infectious Diseases

## 2018-10-04 DIAGNOSIS — B20 Human immunodeficiency virus [HIV] disease: Secondary | ICD-10-CM

## 2018-11-23 ENCOUNTER — Other Ambulatory Visit: Payer: Self-pay | Admitting: Infectious Diseases

## 2018-11-23 DIAGNOSIS — B20 Human immunodeficiency virus [HIV] disease: Secondary | ICD-10-CM

## 2018-12-07 ENCOUNTER — Other Ambulatory Visit: Payer: Self-pay | Admitting: *Deleted

## 2018-12-07 ENCOUNTER — Other Ambulatory Visit: Payer: Self-pay

## 2018-12-07 DIAGNOSIS — Z113 Encounter for screening for infections with a predominantly sexual mode of transmission: Secondary | ICD-10-CM

## 2018-12-07 DIAGNOSIS — B20 Human immunodeficiency virus [HIV] disease: Secondary | ICD-10-CM

## 2018-12-21 ENCOUNTER — Other Ambulatory Visit: Payer: Self-pay

## 2018-12-21 ENCOUNTER — Ambulatory Visit: Payer: Self-pay | Admitting: Infectious Diseases

## 2018-12-28 ENCOUNTER — Other Ambulatory Visit: Payer: Self-pay

## 2019-01-07 ENCOUNTER — Other Ambulatory Visit: Payer: Self-pay

## 2019-01-07 ENCOUNTER — Other Ambulatory Visit: Payer: Medicaid Other

## 2019-01-07 ENCOUNTER — Other Ambulatory Visit (HOSPITAL_COMMUNITY)
Admission: RE | Admit: 2019-01-07 | Discharge: 2019-01-07 | Disposition: A | Discharge status: Home / Self Care | Payer: Medicaid Other | Source: Ambulatory Visit | Attending: Infectious Diseases | Admitting: Infectious Diseases

## 2019-01-07 DIAGNOSIS — Z113 Encounter for screening for infections with a predominantly sexual mode of transmission: Secondary | ICD-10-CM

## 2019-01-07 DIAGNOSIS — B20 Human immunodeficiency virus [HIV] disease: Secondary | ICD-10-CM

## 2019-01-08 LAB — T-HELPER CELL (CD4) - (RCID CLINIC ONLY)
CD4 % Helper T Cell: 16 % — ABNORMAL LOW (ref 33–65)
CD4 T Cell Abs: 229 /uL — ABNORMAL LOW (ref 400–1790)

## 2019-01-09 LAB — URINE CYTOLOGY ANCILLARY ONLY
Chlamydia: NEGATIVE
Neisseria Gonorrhea: NEGATIVE

## 2019-01-11 ENCOUNTER — Encounter: Payer: Self-pay | Admitting: Infectious Diseases

## 2019-01-13 LAB — COMPLETE METABOLIC PANEL WITH GFR
AG Ratio: 1.1 (calc) (ref 1.0–2.5)
ALT: 13 U/L (ref 6–29)
AST: 34 U/L (ref 10–35)
Albumin: 4.1 g/dL (ref 3.6–5.1)
Alkaline phosphatase (APISO): 52 U/L (ref 31–125)
BUN/Creatinine Ratio: 13 (calc) (ref 6–22)
BUN: 15 mg/dL (ref 7–25)
CO2: 28 mmol/L (ref 20–32)
Calcium: 9.9 mg/dL (ref 8.6–10.2)
Chloride: 103 mmol/L (ref 98–110)
Creat: 1.19 mg/dL — ABNORMAL HIGH (ref 0.50–1.10)
GFR, Est African American: 63 mL/min/{1.73_m2} (ref >=60)
GFR, Est Non African American: 55 mL/min/{1.73_m2} — ABNORMAL LOW (ref >=60)
Globulin: 3.6 g/dL (calc) (ref 1.9–3.7)
Glucose, Bld: 90 mg/dL (ref 65–99)
Potassium: 3.6 mmol/L (ref 3.5–5.3)
Sodium: 139 mmol/L (ref 135–146)
Total Bilirubin: 0.6 mg/dL (ref 0.2–1.2)
Total Protein: 7.7 g/dL (ref 6.1–8.1)

## 2019-01-13 LAB — CBC WITH DIFFERENTIAL/PLATELET
Absolute Monocytes: 270 cells/uL (ref 200–950)
Basophils Absolute: 12 cells/uL (ref 0–200)
Basophils Relative: 0.2 %
Eosinophils Absolute: 102 cells/uL (ref 15–500)
Eosinophils Relative: 1.7 %
HCT: 40.8 % (ref 35.0–45.0)
Hemoglobin: 12.5 g/dL (ref 11.7–15.5)
Lymphs Abs: 1518 cells/uL (ref 850–3900)
MCH: 24.8 pg — ABNORMAL LOW (ref 27.0–33.0)
MCHC: 30.6 g/dL — ABNORMAL LOW (ref 32.0–36.0)
MCV: 80.8 fL (ref 80.0–100.0)
Monocytes Relative: 4.5 %
Neutro Abs: 4098 cells/uL (ref 1500–7800)
Neutrophils Relative %: 68.3 %
Platelets: 179 10*3/uL (ref 140–400)
RBC: 5.05 10*6/uL (ref 3.80–5.10)
RDW: 13.8 % (ref 11.0–15.0)
Total Lymphocyte: 25.3 %
WBC: 6 10*3/uL (ref 3.8–10.8)

## 2019-01-13 LAB — HIV-1 RNA QUANT-NO REFLEX-BLD
HIV 1 RNA Quant: <20 copies/mL
HIV-1 RNA Quant, Log: <1.3 Log copies/mL

## 2019-01-13 LAB — RPR: RPR Ser Ql: NONREACTIVE

## 2019-01-19 ENCOUNTER — Other Ambulatory Visit: Payer: Self-pay

## 2019-01-19 ENCOUNTER — Other Ambulatory Visit (HOSPITAL_COMMUNITY)
Admission: RE | Admit: 2019-01-19 | Discharge: 2019-01-19 | Disposition: A | Discharge status: Home / Self Care | Payer: Medicaid Other | Source: Ambulatory Visit | Attending: Infectious Diseases | Admitting: Infectious Diseases

## 2019-01-19 ENCOUNTER — Ambulatory Visit (INDEPENDENT_AMBULATORY_CARE_PROVIDER_SITE_OTHER): Payer: Medicaid Other | Admitting: Infectious Diseases

## 2019-01-19 VITALS — BP 153/100 | HR 59

## 2019-01-19 DIAGNOSIS — R87610 Atypical squamous cells of undetermined significance on cytologic smear of cervix (ASC-US): Secondary | ICD-10-CM

## 2019-01-19 DIAGNOSIS — Z862 Personal history of diseases of the blood and blood-forming organs and certain disorders involving the immune mechanism: Secondary | ICD-10-CM | POA: Diagnosis not present

## 2019-01-19 DIAGNOSIS — Z124 Encounter for screening for malignant neoplasm of cervix: Secondary | ICD-10-CM

## 2019-01-19 DIAGNOSIS — N951 Menopausal and female climacteric states: Secondary | ICD-10-CM | POA: Diagnosis not present

## 2019-01-19 DIAGNOSIS — Z0000 Encounter for general adult medical examination without abnormal findings: Secondary | ICD-10-CM | POA: Diagnosis not present

## 2019-01-19 DIAGNOSIS — Z713 Dietary counseling and surveillance: Secondary | ICD-10-CM | POA: Diagnosis not present

## 2019-01-19 DIAGNOSIS — Z79899 Other long term (current) drug therapy: Secondary | ICD-10-CM | POA: Diagnosis not present

## 2019-01-19 DIAGNOSIS — Z21 Asymptomatic human immunodeficiency virus [HIV] infection status: Secondary | ICD-10-CM

## 2019-01-19 NOTE — Assessment & Plan Note (Signed)
Repeat pap smear today °

## 2019-01-19 NOTE — Patient Instructions (Addendum)
  Medicaid will only allow 1 month fills at a time - when your new insurance turns on we can try a 90-day supply.   Will call you with your results today.   For your birth control pills - I will research the one I think will be best for you and send in this prescription today.    Please come back to see Samantha Norris again in 4 months

## 2019-01-19 NOTE — Assessment & Plan Note (Signed)
Continue iron supplementation and iron rich foods. Careful attention paid to discuss dosing apart from Biktarvy > 4 hours.

## 2019-01-19 NOTE — Assessment & Plan Note (Signed)
Agree with low carb diet approach with time restricted eating. Do not recommend prolonged fasts and to always eat when feeling poorly. Her blood pressure is quite elevated today and I worry how many weight loss pills she is taking in daily. I advised she should use them sparingly and not regularly as they do not teach habitual change and likely only going to make her caffeine addicted. Her blood pressure is quite high today and likely contributing to this.   She is fasting - will repeat her lipid panel today with high risk medication use and dietary change.

## 2019-01-19 NOTE — Assessment & Plan Note (Signed)
Vaccine counseling provided.

## 2019-01-19 NOTE — Assessment & Plan Note (Addendum)
Her CD4 is > 200 with suppressed viral load now with regular Biktarvy use. She has already self-stopped her Bactrim; no need to continue it now. She seems to be doing well and has a good handle on interpreting her labs and trying to sort out some of the more emotional barriers she has with her staying in care for her HIV. She has a hard time talking about it still. Will again discuss counseling options and information provided about services and possible support groups. She is not interested. It sounds like she is losing her Medicaid due to availability of other insurance - she will notify the clinic for possibility of 90-day refill to help with her travel to pick up. Medicaid unable to dispense more than a month at a time currently.  Pap smear today.  Declined flu shot today.  Return in about 4 months (around 05/21/2019).

## 2019-01-19 NOTE — Progress Notes (Signed)
Name: Samantha Norris  DOB: 05/04/1970 MRN: [id number] PCP: Patient, No Pcp Per    Patient Active Problem List   Diagnosis Date Noted   ASCUS of cervix with negative high risk HPV 01/19/2019   Weight loss counseling, encounter for 01/19/2019   Decreased hearing of left ear 12/22/2017   Thrombocytopenia (HCC) 12/22/2017   health maintenance  12/22/2017   Menorrhagia 08/21/2009   IRON DEFICIENCY ANEMIA, HX OF 08/21/2009   HIV (human immunodeficiency virus infection) (HCC) 10/14/2006     Brief Narrative:  Samantha Norris is a 48 y.o. female with HIV disease.  CD4 nadir less than 100 with poor adherence. HIV Risk: heterosexual.  History of OIs: none  Previous Regimens:  Prezista +Norvir + Truvada (pregnancy)   Kaletra BID + Truvada, 2013 --> suppressed   Biktarvy  >> suppressed  Genotypes:  2013 no significant mutations   Subjective:  CC: HIV follow up care. Would like her lipids and vitamin d checked. She has changed quite a bit about her eating/exercise habits. Irregular menses.    HPI:  Samantha Norris is here today for follow up on her HIV/AIDS. LOV was back in October 2019 when we restarted her on ARVs with once daily Biktarvy. She has continued to take this once daily since our last visit together. She did not refill her Bactrim however and sounds like she stopped taking that many months ago. Since she has been on her Biktarvy regularly she has noticed some increase in her energy and overall quality of life. She has been working hard on changing a lot about her life as she was unhappy about weight gain. She has incorporated intermittent fasting/time restricted eating, lower carbohydrate approach and has picked up exercising. She has lost nearly 15 lbs. She is taking a few supplements to augment her weightloss and increase her energy levels but wants to discuss if they are safe to take with HIV meds. She does not take them every day but several days a week she will to  increase her energy. She takes these supplements in the AM and Biktarvy at night.   Requesting vitamin d level to be checked considering its impact on immune health.   She is having increased in hot flashes, night sweats, insomnia and infrequent menstrual cycles and this pattern has worsened. She feels that she is going through menopause and wants to discuss managing symptoms as well as regulating her menstrual cycle a bit to help suppress the heavy menstrual flow. She takes iron supplements as well to combat associated fatigue and anemia.   She is due for a repeated pap smear (was supposed to be 5m follow up but did not return). No changes to GYN history aside from more infrequent and irregular menses. Her mother went through similar changes around this age in life.     Review of Systems  Constitutional: Negative for chills and fever.  HENT: Negative for tinnitus.   Eyes: Negative for blurred vision and photophobia.  Respiratory: Negative for cough and sputum production.   Cardiovascular: Negative for chest pain.  Gastrointestinal: Negative for diarrhea, nausea and vomiting.  Genitourinary: Negative for dysuria.       Heavy menses   Musculoskeletal: Negative.   Skin: Negative for rash.  Neurological: Negative for headaches.  Psychiatric/Behavioral: Negative for depression. The patient is not nervous/anxious.     Past Medical History:  Diagnosis Date   Abdominal pain    Anemia    Blood transfusion  Constipation    HIV infection ([place])     Outpatient Medications Prior to Visit  Medication Sig Dispense Refill   BIKTARVY 50-200-25 MG TABS tablet TAKE 1 TABLET BY MOUTH DAILY AT THE SAME TIME EACH DAY WITH OR WITHOUT FOOD 30 tablet 0   Ferrous Sulfate (IRON SUPPLEMENT PO) Take by mouth daily.     sulfamethoxazole-trimethoprim (BACTRIM DS,SEPTRA DS) 800-160 MG tablet Take 1 tablet by mouth daily. (Patient not taking: Reported on 01/19/2019) 30 tablet 3   No  facility-administered medications prior to visit.      No Known Allergies  Social History   Tobacco Use   Smoking status: Never Smoker   Smokeless tobacco: Never Used  Substance Use Topics   Alcohol use: No   Drug use: No    Social History   Substance and Sexual Activity  Sexual Activity Not on file     Objective:   Vitals:   01/19/19 1119  BP: (!) 153/100  Pulse: (!) 59   There is no height or weight on file to calculate BMI.  Physical Exam Constitutional:      General: She is not in acute distress.    Appearance: She is well-developed.  HENT:     Mouth/Throat:     Mouth: No oral lesions.     Dentition: No dental abscesses.  Cardiovascular:     Rate and Rhythm: Normal rate and regular rhythm.     Heart sounds: Normal heart sounds.  Pulmonary:     Effort: Pulmonary effort is normal.     Breath sounds: Normal breath sounds.  Abdominal:     General: There is no distension.     Palpations: Abdomen is soft.     Tenderness: There is no abdominal tenderness.  Musculoskeletal: Normal range of motion.        General: No tenderness.  Lymphadenopathy:     Cervical: No cervical adenopathy.  Skin:    General: Skin is warm and dry.     Findings: No rash.  Neurological:     Mental Status: She is alert and oriented to person, place, and time.  Psychiatric:        Judgment: Judgment normal.     Lab Results Lab Results  Component Value Date   WBC 6.0 01/07/2019   HGB 12.5 01/07/2019   HCT 40.8 01/07/2019   MCV 80.8 01/07/2019   PLT 179 01/07/2019    Lab Results  Component Value Date   CREATININE 1.19 (H) 01/07/2019   BUN 15 01/07/2019   NA 139 01/07/2019   K 3.6 01/07/2019   CL 103 01/07/2019   CO2 28 01/07/2019    Lab Results  Component Value Date   ALT 13 01/07/2019   AST 34 01/07/2019   ALKPHOS 55 07/22/2011   BILITOT 0.6 01/07/2019    Lab Results  Component Value Date   CHOL 181 12/01/2017   HDL 37 (L) 12/01/2017   LDLCALC 107 (H)  12/01/2017   TRIG 260 (H) 12/01/2017   CHOLHDL 4.9 12/01/2017   HIV 1 RNA Quant (copies/mL)  Date Value  01/07/2019 <20 NOT DETECTED  02/10/2018 <20 DETECTED (A)  12/01/2017 168,000 (H)   CD4 T Cell Abs (/uL)  Date Value  01/07/2019 229 (L)  02/10/2018 110 (L)  12/01/2017 80 (L)     Assessment & Plan:   Problem List Items Addressed This Visit      Unprioritized   HIV (human immunodeficiency virus infection) ([place]) (Chronic)    Her  CD4 is > 200 with suppressed viral load now with regular Biktarvy use. She has already self-stopped her Bactrim; no need to continue it now. She seems to be doing well and has a good handle on interpreting her labs and trying to sort out some of the more emotional barriers she has with her staying in care for her HIV. She has a hard time talking about it still. Will again discuss counseling options and information provided about services and possible support groups. She is not interested. It sounds like she is losing her Medicaid due to availability of other insurance - she will notify the clinic for possibility of 90-day refill to help with her travel to pick up. Medicaid unable to dispense more than a month at a time currently.  Pap smear today.  Declined flu shot today.  Return in about 4 months (around 05/21/2019).       IRON DEFICIENCY ANEMIA, HX OF    Continue iron supplementation and iron rich foods. Careful attention paid to discuss dosing apart from Biktarvy > 4 hours.       health maintenance     Vaccine counseling provided.       ASCUS of cervix with negative high risk HPV    Repeat pap smear today.       Weight loss counseling, encounter for    Agree with low carb diet approach with time restricted eating. Do not recommend prolonged fasts and to always eat when feeling poorly. Her blood pressure is quite elevated today and I worry how many weight loss pills she is taking in daily. I advised she should use them sparingly and not regularly  as they do not teach habitual change and likely only going to make her caffeine addicted. Her blood pressure is quite high today and likely contributing to this.   She is fasting - will repeat her lipid panel today with high risk medication use and dietary change.        Other Visit Diagnoses    Screening for cervical cancer    -  Primary   Relevant Orders   Cytology - PAP( [place])   High risk medication use       Relevant Orders   Lipid panel   Vitamin D (25 hydroxy)       Return in about 4 months (around 05/21/2019).  Rexene Alberts, MSN, NP-C [facility] Pager: [phone] Office: [phone]  01/19/19  6:54 PM

## 2019-01-20 LAB — CYTOLOGY - PAP
Adequacy: ABSENT
Diagnosis: NEGATIVE
High risk HPV: NEGATIVE
Molecular Disclaimer: 56
Molecular Disclaimer: DETECTED
Molecular Disclaimer: NORMAL

## 2019-01-20 LAB — LIPID PANEL
Cholesterol: 192 mg/dL (ref <200)
HDL: 51 mg/dL (ref >=50)
LDL Cholesterol (Calc): 114 mg/dL (calc) — ABNORMAL HIGH
Non-HDL Cholesterol (Calc): 141 mg/dL (calc) — ABNORMAL HIGH (ref <130)
Total CHOL/HDL Ratio: 3.8 (calc) (ref <5.0)
Triglycerides: 156 mg/dL — ABNORMAL HIGH (ref <150)

## 2019-01-20 LAB — VITAMIN D 25 HYDROXY (VIT D DEFICIENCY, FRACTURES): Vit D, 25-Hydroxy: 31 ng/mL (ref 30–100)

## 2019-02-07 ENCOUNTER — Other Ambulatory Visit: Payer: Self-pay | Admitting: Infectious Diseases

## 2019-02-07 DIAGNOSIS — B20 Human immunodeficiency virus [HIV] disease: Secondary | ICD-10-CM

## 2019-05-12 ENCOUNTER — Other Ambulatory Visit: Payer: Self-pay | Admitting: Infectious Diseases

## 2019-05-12 DIAGNOSIS — B20 Human immunodeficiency virus [HIV] disease: Secondary | ICD-10-CM

## 2020-10-05 ENCOUNTER — Other Ambulatory Visit: Payer: Self-pay

## 2020-10-05 DIAGNOSIS — Z79899 Other long term (current) drug therapy: Secondary | ICD-10-CM

## 2020-10-05 DIAGNOSIS — Z113 Encounter for screening for infections with a predominantly sexual mode of transmission: Secondary | ICD-10-CM

## 2020-10-05 DIAGNOSIS — B20 Human immunodeficiency virus [HIV] disease: Secondary | ICD-10-CM

## 2020-10-05 DIAGNOSIS — Z23 Encounter for immunization: Secondary | ICD-10-CM

## 2020-10-12 ENCOUNTER — Other Ambulatory Visit: Payer: Self-pay

## 2020-10-12 ENCOUNTER — Other Ambulatory Visit: Payer: 59

## 2020-10-12 ENCOUNTER — Other Ambulatory Visit (HOSPITAL_COMMUNITY)
Admission: RE | Admit: 2020-10-12 | Discharge: 2020-10-12 | Disposition: A | Discharge status: Home / Self Care | Payer: 59 | Source: Ambulatory Visit | Attending: Infectious Diseases | Admitting: Infectious Diseases

## 2020-10-12 ENCOUNTER — Telehealth: Payer: Self-pay | Admitting: *Deleted

## 2020-10-12 DIAGNOSIS — Z113 Encounter for screening for infections with a predominantly sexual mode of transmission: Secondary | ICD-10-CM

## 2020-10-12 DIAGNOSIS — B20 Human immunodeficiency virus [HIV] disease: Secondary | ICD-10-CM

## 2020-10-12 DIAGNOSIS — Z79899 Other long term (current) drug therapy: Secondary | ICD-10-CM

## 2020-10-12 MED ORDER — BIKTARVY 50-200-25 MG PO TABS: ORAL_TABLET | ORAL | 0 refills | Status: DC

## 2020-10-12 NOTE — Telephone Encounter (Signed)
Patient here for labs, asked to speak with nurse regarding medication fill. She has had 4 months of Biktarvy sent to pharmacy since 01/2019. She states she has had difficulty committing to her medication, had difficulty through Covid, and also recently lost her brother. She would start medication, but then stop it "after a while" and then forget to take it. She states she knows not to stretch biktarvy out, taking it every other day, or 1 week but not the next. She has been taking her last bottle of biktarvy daily, now has been out for a few days.  RN spoke with Marcos Eke who added integrase testing, agreed with 1 refill until patient is seen by [facility] 10/27/20. Patient aware. Andree Coss, RN

## 2020-10-13 ENCOUNTER — Other Ambulatory Visit: Payer: Self-pay | Admitting: Pharmacist

## 2020-10-13 LAB — URINE CYTOLOGY ANCILLARY ONLY
Chlamydia: NEGATIVE
Comment: NEGATIVE
Comment: NORMAL
Neisseria Gonorrhea: NEGATIVE

## 2020-10-13 LAB — T-HELPER CELL (CD4) - (RCID CLINIC ONLY)
CD4 % Helper T Cell: 22 % — ABNORMAL LOW (ref 33–65)
CD4 T Cell Abs: 306 /uL — ABNORMAL LOW (ref 400–1790)

## 2020-10-13 NOTE — Progress Notes (Signed)
Medication Samples have been provided to the patient.  Drug name: Biktarvy        Strength: 50/200/25 mg       Qty: 2 bottles (14 tablets)   LOT: CHSYVA   Exp.Date: 09/28/2022  Dosing instructions: Take one tablet by mouth once daily  The patient has been instructed regarding the correct time, dose, and frequency of taking this medication, including desired effects and most common side effects.   Izreal Kock L. Jannette Fogo, PharmD, BCIDP, AAHIVP, CPP Clinical Pharmacist Practitioner Infectious Diseases Clinical Pharmacist [facility] 04/10/2020, 10:07 AM

## 2020-10-15 LAB — COMPREHENSIVE METABOLIC PANEL
AG Ratio: 1.3 (calc) (ref 1.0–2.5)
ALT: 11 U/L (ref 6–29)
AST: 17 U/L (ref 10–35)
Albumin: 4.5 g/dL (ref 3.6–5.1)
Alkaline phosphatase (APISO): 59 U/L (ref 31–125)
BUN/Creatinine Ratio: 18 (calc) (ref 6–22)
BUN: 21 mg/dL (ref 7–25)
CO2: 30 mmol/L (ref 20–32)
Calcium: 10.2 mg/dL (ref 8.6–10.2)
Chloride: 100 mmol/L (ref 98–110)
Creat: 1.17 mg/dL — ABNORMAL HIGH (ref 0.50–1.10)
Globulin: 3.5 g/dL (calc) (ref 1.9–3.7)
Glucose, Bld: 113 mg/dL — ABNORMAL HIGH (ref 65–99)
Potassium: 3.8 mmol/L (ref 3.5–5.3)
Sodium: 137 mmol/L (ref 135–146)
Total Bilirubin: 0.8 mg/dL (ref 0.2–1.2)
Total Protein: 8 g/dL (ref 6.1–8.1)

## 2020-10-15 LAB — LIPID PANEL
Cholesterol: 256 mg/dL — ABNORMAL HIGH (ref <200)
HDL: 45 mg/dL — ABNORMAL LOW (ref >=50)
LDL Cholesterol (Calc): 167 mg/dL (calc) — ABNORMAL HIGH
Non-HDL Cholesterol (Calc): 211 mg/dL (calc) — ABNORMAL HIGH (ref <130)
Total CHOL/HDL Ratio: 5.7 (calc) — ABNORMAL HIGH (ref <5.0)
Triglycerides: 270 mg/dL — ABNORMAL HIGH (ref <150)

## 2020-10-15 LAB — CBC WITH DIFFERENTIAL/PLATELET
Absolute Monocytes: 240 cells/uL (ref 200–950)
Basophils Absolute: 10 cells/uL (ref 0–200)
Basophils Relative: 0.2 %
Eosinophils Absolute: 143 cells/uL (ref 15–500)
Eosinophils Relative: 2.8 %
HCT: 39 % (ref 35.0–45.0)
Hemoglobin: 12.3 g/dL (ref 11.7–15.5)
Lymphs Abs: 1433 cells/uL (ref 850–3900)
MCH: 24.6 pg — ABNORMAL LOW (ref 27.0–33.0)
MCHC: 31.5 g/dL — ABNORMAL LOW (ref 32.0–36.0)
MCV: 77.8 fL — ABNORMAL LOW (ref 80.0–100.0)
Monocytes Relative: 4.7 %
Neutro Abs: 3274 cells/uL (ref 1500–7800)
Neutrophils Relative %: 64.2 %
Platelets: 78 10*3/uL — ABNORMAL LOW (ref 140–400)
RBC: 5.01 10*6/uL (ref 3.80–5.10)
RDW: 14.5 % (ref 11.0–15.0)
Total Lymphocyte: 28.1 %
WBC: 5.1 10*3/uL (ref 3.8–10.8)

## 2020-10-15 LAB — HIV-1 RNA QUANT-NO REFLEX-BLD
HIV 1 RNA Quant: <20 Copies/mL — ABNORMAL HIGH
HIV-1 RNA Quant, Log: <1.3 Log cps/mL — ABNORMAL HIGH

## 2020-10-15 LAB — RPR: RPR Ser Ql: NONREACTIVE

## 2020-10-18 ENCOUNTER — Other Ambulatory Visit: Payer: Self-pay

## 2020-10-18 ENCOUNTER — Encounter: Payer: 59 | Admitting: Family

## 2020-10-18 ENCOUNTER — Ambulatory Visit: Payer: Self-pay

## 2020-10-24 ENCOUNTER — Encounter: Payer: Self-pay | Admitting: Infectious Diseases

## 2020-10-24 ENCOUNTER — Ambulatory Visit: Payer: 59

## 2020-10-24 ENCOUNTER — Telehealth: Payer: Self-pay

## 2020-10-24 ENCOUNTER — Other Ambulatory Visit (HOSPITAL_COMMUNITY)
Admission: RE | Admit: 2020-10-24 | Discharge: 2020-10-24 | Disposition: A | Discharge status: Home / Self Care | Payer: 59 | Source: Ambulatory Visit | Attending: Infectious Diseases | Admitting: Infectious Diseases

## 2020-10-24 ENCOUNTER — Other Ambulatory Visit: Payer: Self-pay

## 2020-10-24 ENCOUNTER — Other Ambulatory Visit (HOSPITAL_COMMUNITY): Payer: Self-pay

## 2020-10-24 ENCOUNTER — Ambulatory Visit (INDEPENDENT_AMBULATORY_CARE_PROVIDER_SITE_OTHER): Payer: 59 | Admitting: Infectious Diseases

## 2020-10-24 VITALS — BP 117/83 | HR 78 | Temp 98.8°F | Wt 243.5 lb

## 2020-10-24 DIAGNOSIS — Z21 Asymptomatic human immunodeficiency virus [HIV] infection status: Secondary | ICD-10-CM

## 2020-10-24 DIAGNOSIS — N898 Other specified noninflammatory disorders of vagina: Secondary | ICD-10-CM

## 2020-10-24 DIAGNOSIS — B009 Herpesviral infection, unspecified: Secondary | ICD-10-CM | POA: Diagnosis not present

## 2020-10-24 DIAGNOSIS — Z113 Encounter for screening for infections with a predominantly sexual mode of transmission: Secondary | ICD-10-CM | POA: Insufficient documentation

## 2020-10-24 DIAGNOSIS — Z124 Encounter for screening for malignant neoplasm of cervix: Secondary | ICD-10-CM | POA: Insufficient documentation

## 2020-10-24 DIAGNOSIS — J309 Allergic rhinitis, unspecified: Secondary | ICD-10-CM

## 2020-10-24 MED ORDER — METRONIDAZOLE 500 MG PO TABS: 500.0000 mg | ORAL_TABLET | Freq: Two times a day (BID) | ORAL | 1 refills | Status: DC

## 2020-10-24 MED ORDER — VALACYCLOVIR HCL 1 G PO TABS: 1000.0000 mg | ORAL_TABLET | Freq: Two times a day (BID) | ORAL | 1 refills | Status: DC

## 2020-10-24 NOTE — Telephone Encounter (Signed)
Medication Samples have been provided to the patient.  Drug name: Biktarvy        Strength: 50/200/25 mg       Qty: 28   LOT: CHSYSA    Exp.Date: 06/23  Dosing instructions: Take one tablet by mouth once daily  Clearance Coots, CPhT Specialty Pharmacy Patient [facility] for Infectious Disease Phone: [phone] Fax:  [phone]

## 2020-10-24 NOTE — Progress Notes (Signed)
Name: Samantha Norris  DOB: 1971-01-01 MRN: [id number] PCP: Patient, No Pcp Per (Inactive)     Brief Narrative:  Samantha Norris is a 50 y.o. female with HIV disease. Dx 2013.  CD4 nadir < 100 HIV Risk: sexual History of OIs: none known Intake Labs : Hep B sAg (-), sAb (+), cAb (-); Hep A (+), Hep C (-) Quantiferon (-) HLA B*5701 (-) G6PD: (unknown)   Previous Regimens: Prezista + Norvir + Truvada (pregnancy) Kaletra BID + Truvada, 2013, suppressed  Biktarvy, suppressed    Genotypes: 2013 - wildtype   Subjective:   Chief Complaint  Patient presents with   Follow-up     HPI: Samantha Norris is here for re-engagement in HIV care. Last office visit was 2020 when Samantha Norris was off medication for 1 year.  Samantha Norris says Samantha Norris has had a lot going on in Samantha Norris life regarding some well, COVID infection and other stressors.  Samantha Norris finally feels that things are getting under control.  Samantha Norris has space to breathe and move forward.  Samantha Norris states that Samantha Norris has been concerned Samantha Norris is regained about 20 pounds after previously having tried to lose weight.  Samantha Norris came to our office for refills earlier this month and apparently there was only 4 months of Biktarvy sent to Samantha Norris pharmacy October 2020.  Samantha Norris says Samantha Norris is always had difficulty committing to Samantha Norris medication but really hopeful that Samantha Norris is able to continue this simple regimen since Samantha Norris tolerates it well in hopes that Samantha Norris is improving.  Samantha Norris has for the last few months been taking Biktarvy every day without any missed doses.  Samantha Norris has a PCP through another medical record system who Samantha Norris says has no idea about Samantha Norris HIV disease.  Whenever Samantha Norris is prescribed a medication Samantha Norris only stopped with Samantha Norris pharmacy team to make sure that Samantha Norris has no interactions and they are safe to take together.  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  No significant changes to Samantha Norris health aside  from increased stress, grief with loss and weight gain.  Samantha Norris has been having trouble with sinus symptoms.  Currently on a round of doxycycline and a second round of prednisone.  Mostly nasal congestion and postnasal drip.  No fevers or severe pain.  Samantha Norris states that Samantha Norris does have profound trouble with allergies and is trying to get into see an allergist to optimize treatment.  Samantha Norris is taking albuterol and nasocort both as needed.  Requesting a Pap smear update today.  Last Pap smear with Samantha Norris last office visit in 2020 with normal results on cytology and negative HPV.  Not sexually active  Samantha Norris no longer gets Samantha Norris menstrual cycles x 3 years.   Samantha Norris does get herpes outbreaks periodically which Samantha Norris treats with 3 to 5 days of Valtrex twice a day.  Samantha Norris is requesting refill.  No current problems with rashes.   Depression screen [facility] 2/9 10/24/2020  Decreased Interest 0  Down, Depressed, Hopeless 0  PHQ - 2 Score 0     Review of Systems  Constitutional:  Negative for chills, diaphoresis and unexpected weight change.  HENT:  Positive for postnasal drip, rhinorrhea and sinus pressure.   Gastrointestinal:  Negative for blood in stool and rectal pain.  Genitourinary:  Negative for dyspareunia, dysuria, genital sores, menstrual problem, pelvic pain, urgency and vaginal discharge.  Skin:  Negative for  rash.  Psychiatric/Behavioral:  Negative for dysphoric mood and sleep disturbance. The patient is not nervous/anxious.      Past Medical History:  Diagnosis Date   Abdominal pain    Anemia    Blood transfusion    Constipation    HIV infection (HCC)     Outpatient Medications Prior to Visit  Medication Sig Dispense Refill   bictegravir-emtricitabine-tenofovir AF (BIKTARVY) 50-200-25 MG TABS tablet TAKE 1 TABLET BY MOUTH DAILY AT THE SAME TIME EACH DAY WITH OR WITHOUT FOOD 30 tablet 0   doxycycline (VIBRAMYCIN) 100 MG capsule TAKE 1 CAPSULE BY MOUTH TWICE A DAY FOR 30 DAYS     Ferrous Sulfate (IRON  SUPPLEMENT PO) Take by mouth daily.     sulfamethoxazole-trimethoprim (BACTRIM DS,SEPTRA DS) 800-160 MG tablet Take 1 tablet by mouth daily. 30 tablet 3   No facility-administered medications prior to visit.     No Known Allergies  Social History   Tobacco Use   Smoking status: Never   Smokeless tobacco: Never  Substance Use Topics   Alcohol use: No   Drug use: No    Family History  Problem Relation Age of Onset   Heart disease Father        heart failure    Social History   Substance and Sexual Activity  Sexual Activity Not on file     Objective:   Vitals:   10/24/20 0921  BP: 117/83  Pulse: 78  Temp: 98.8 F (37.1 C)  TempSrc: Oral  SpO2: 98%  Weight: 243 lb 8 oz (110.5 kg)   Body mass index is 43.13 kg/m.  Physical Exam Vitals reviewed. Exam conducted with a chaperone present.  HENT:     Mouth/Throat:     Mouth: No oral lesions.     Dentition: No dental abscesses.  Cardiovascular:     Rate and Rhythm: Normal rate and regular rhythm.     Heart sounds: Normal heart sounds.  Pulmonary:     Effort: Pulmonary effort is normal.     Breath sounds: Normal breath sounds.  Abdominal:     General: There is no distension.     Palpations: Abdomen is soft.     Tenderness: There is no abdominal tenderness.  Genitourinary:    Pubic Area: No rash.      Labia:        Right: No rash, tenderness or lesion.        Left: No rash, tenderness or lesion.      Vagina: Vaginal discharge (milky white) present.     Cervix: No cervical motion tenderness or friability.  Musculoskeletal:        General: No tenderness. Normal range of motion.  Lymphadenopathy:     Cervical: No cervical adenopathy.  Skin:    General: Skin is warm and dry.     Findings: No rash.  Neurological:     Mental Status: Samantha Norris is alert and oriented to person, place, and time.  Psychiatric:        Judgment: Judgment normal.    Lab Results Lab Results  Component Value Date   WBC 5.1 10/12/2020    HGB 12.3 10/12/2020   HCT 39.0 10/12/2020   MCV 77.8 (L) 10/12/2020   PLT 78 (L) 10/12/2020    Lab Results  Component Value Date   CREATININE 1.17 (H) 10/12/2020   BUN 21 10/12/2020   NA 137 10/12/2020   K 3.8 10/12/2020   CL 100 10/12/2020   CO2 30 10/12/2020  Lab Results  Component Value Date   ALT 11 10/12/2020   AST 17 10/12/2020   ALKPHOS 55 07/22/2011   BILITOT 0.8 10/12/2020    Lab Results  Component Value Date   CHOL 256 (H) 10/12/2020   HDL 45 (L) 10/12/2020   LDLCALC 167 (H) 10/12/2020   TRIG 270 (H) 10/12/2020   CHOLHDL 5.7 (H) 10/12/2020   HIV 1 RNA Quant  Date Value  10/12/2020 <20 Copies/mL (H)  01/07/2019 <20 NOT DETECTED copies/mL  02/10/2018 <20 DETECTED copies/mL (A)   CD4 T Cell Abs (/uL)  Date Value  10/12/2020 306 (L)  01/07/2019 229 (L)  02/10/2018 110 (L)     Assessment & Plan:   Problem List Items Addressed This Visit       Unprioritized   HIV (human immunodeficiency virus infection) ([place]) - Primary (Chronic)    Fortunately my read his viral load recently was undetectable.  Samantha Norris was very excited to hear this today.  I not have any concerns about Samantha Norris continuing with Biktarvy once daily and we will send in a prescription for Samantha Norris.  Counseled extensively both by myself and our other nursing team staff to never space out or stretch out Samantha Norris medication.  Discussed the concern over required resistance mutations making it difficult to treat Samantha Norris completely in the future. It seems like Samantha Norris is got a lot of shame and suffers from stigma related to Samantha Norris diagnosis.  This has been a constant barrier for Samantha Norris based off of the pattern seen over the last few years.  Samantha Norris does seem very motivated to continue Samantha Norris health journey and to control today however.  This is nice to see.  Samantha Norris CD4 count has recovered to 306 now.  Samantha Norris does not need any Bactrim prophylaxis. We will have Samantha Norris stop and talk with our financial team today it sounds like Samantha Norris needs some co-pay  assistance. Pap smear updated today along with routine STI screen. We will have Samantha Norris return in 4 months so we can discuss and update immunizations.       Relevant Medications   valACYclovir (VALTREX) 1000 MG tablet   metroNIDAZOLE (FLAGYL) 500 MG tablet   Vaginal discharge    Samantha Norris states Samantha Norris has had some discomfort since starting antibiotics.  Discharge is thin and milky in appearance more consistent with BV.  We discussed treatment with Flagyl by mouth twice a day for 7 days.  We will collect wet prep and also check for trichomonas, gonorrhea, chlamydia and Candida.  Further treatment pending these results.       Relevant Medications   metroNIDAZOLE (FLAGYL) 500 MG tablet   Screening for cervical cancer    Normal pelvic exam aside from vaginal drainage without cervicitis.  Cervical brushing collected for cytology and HPV.  Discussed recommended screening interval for women living with HIV disease meant to be lifelong and at an interval of Q1-3 years pending results. Acceptable to space out Q3y with 3 consecutively normal exams. Further recommendations for ROXANA LAI to follow today's results.  Results will be communicated to the patient via telephone call.         Relevant Orders   Cytology - PAP( [place])   Herpes    Samantha Norris has good results on the on-demand treatment approach with Valtrex.  We will send in refills for Samantha Norris to continue using 1 g twice daily x3 days at onset of burning prodrome and rash.  Advised that if Samantha Norris is having 3-4 more outbreaks  per year Samantha Norris should consider a once daily supplement of 1 g to see if this helps.  I asked Samantha Norris to let me know so I can update Samantha Norris prescription if this is what Samantha Norris would like to try.       Relevant Medications   valACYclovir (VALTREX) 1000 MG tablet   metroNIDAZOLE (FLAGYL) 500 MG tablet   Allergic sinusitis    Is not clear to me that Samantha Norris has a secondary bacterial infection.  Samantha Norris is only using Samantha Norris Nasacort as needed.  We discussed  using this more consistently during high allergy season for Samantha Norris as I explained this is likely the cause of Samantha Norris symptoms.  Samantha Norris is also getting into care with an allergist to consider shots.       Relevant Medications   doxycycline (VIBRAMYCIN) 100 MG capsule   valACYclovir (VALTREX) 1000 MG tablet   metroNIDAZOLE (FLAGYL) 500 MG tablet   Other Visit Diagnoses     Routine screening for STI (sexually transmitted infection)       Relevant Orders   Urine cytology ancillary only       Janene Madeira, MSN, NP-C [place] [facility] [place] Pager: [phone] Office: [phone]  10/25/20  10:20 AM

## 2020-10-24 NOTE — Patient Instructions (Addendum)
Please stop by to see our financial team today.   We will probably need to get you another 1-2 weeks of samples of the biktarvy.   Will plan to see you back in 4 months to check in on how you are doing and repeat your blood work.   Will call you with your pap smear results once available in the next 1-2 weeks.

## 2020-10-25 DIAGNOSIS — N898 Other specified noninflammatory disorders of vagina: Secondary | ICD-10-CM | POA: Insufficient documentation

## 2020-10-25 DIAGNOSIS — J309 Allergic rhinitis, unspecified: Secondary | ICD-10-CM | POA: Insufficient documentation

## 2020-10-25 DIAGNOSIS — B009 Herpesviral infection, unspecified: Secondary | ICD-10-CM | POA: Insufficient documentation

## 2020-10-25 DIAGNOSIS — Z124 Encounter for screening for malignant neoplasm of cervix: Secondary | ICD-10-CM | POA: Insufficient documentation

## 2020-10-25 LAB — URINE CYTOLOGY ANCILLARY ONLY
Chlamydia: NEGATIVE
Comment: NEGATIVE
Comment: NORMAL
Neisseria Gonorrhea: NEGATIVE

## 2020-10-25 NOTE — Assessment & Plan Note (Signed)
She states she has had some discomfort since starting antibiotics.  Discharge is thin and milky in appearance more consistent with BV.  We discussed treatment with Flagyl by mouth twice a day for 7 days.  We will collect wet prep and also check for trichomonas, gonorrhea, chlamydia and Candida.  Further treatment pending these results.

## 2020-10-25 NOTE — Assessment & Plan Note (Signed)
She has good results on the on-demand treatment approach with Valtrex.  We will send in refills for her to continue using 1 g twice daily x3 days at onset of burning prodrome and rash.  Advised that if she is having 3-4 more outbreaks per year she should consider a once daily supplement of 1 g to see if this helps.  I asked her to let me know so I can update her prescription if this is what she would like to try.

## 2020-10-25 NOTE — Assessment & Plan Note (Signed)
Fortunately my read his viral load recently was undetectable.  She was very excited to hear this today.  I not have any concerns about her continuing with Biktarvy once daily and we will send in a prescription for her.  Counseled extensively both by myself and our other nursing team staff to never space out or stretch out her medication.  Discussed the concern over required resistance mutations making it difficult to treat her completely in the future. It seems like she is got a lot of shame and suffers from stigma related to her diagnosis.  This has been a constant barrier for her based off of the pattern seen over the last few years.  She does seem very motivated to continue her health journey and to control today however.  This is nice to see.  Her CD4 count has recovered to 306 now.  She does not need any Bactrim prophylaxis. We will have her stop and talk with our financial team today it sounds like she needs some co-pay assistance. Pap smear updated today along with routine STI screen. We will have her return in 4 months so we can discuss and update immunizations.

## 2020-10-25 NOTE — Assessment & Plan Note (Signed)
Normal pelvic exam aside from vaginal drainage without cervicitis.  Cervical brushing collected for cytology and HPV.  Discussed recommended screening interval for women living with HIV disease meant to be lifelong and at an interval of Q1-3 years pending results. Acceptable to space out Q3y with 3 consecutively normal exams. Further recommendations for Samantha Norris to follow today's results.  Results will be communicated to the patient via telephone call.

## 2020-10-25 NOTE — Assessment & Plan Note (Signed)
Is not clear to me that she has a secondary bacterial infection.  She is only using her Nasacort as needed.  We discussed using this more consistently during high allergy season for her as I explained this is likely the cause of her symptoms.  She is also getting into care with an allergist to consider shots.

## 2020-10-27 ENCOUNTER — Encounter: Payer: Medicaid Other | Admitting: Infectious Diseases

## 2020-10-27 LAB — CYTOLOGY - PAP
Adequacy: ABSENT
Comment: NEGATIVE
Diagnosis: UNDETERMINED — AB
High risk HPV: NEGATIVE

## 2020-10-31 NOTE — Progress Notes (Signed)
[name] has some low grade abnormal cells on the cervix but no higher risk HPV virus detected - I would like for her to repeat this test in 1 year to follow for changes.  Please call her to let her know and it appears she forgot to schedule a follow up with me in 4 months (end of October / early November).  Thank you.

## 2020-11-06 ENCOUNTER — Telehealth: Payer: Self-pay

## 2020-11-06 NOTE — Telephone Encounter (Signed)
-----   Message from Blanchard Kelch, NP sent at 10/31/2020  1:27 PM EDT ----- Samantha Norris has some low grade abnormal cells on the cervix but no higher risk HPV virus detected - I would like for her to repeat this test in 1 year to follow for changes.  Please call her to let her know and it appears she forgot to schedule a follow up with me in 4 months (end of October / early November).  Thank you.

## 2020-11-06 NOTE — Telephone Encounter (Signed)
Results relayed to patient; patient scheduled for follow up with Judeth Cornfield at the end of October.   Cynthia Cogle Loyola Mast, RN

## 2020-12-12 ENCOUNTER — Telehealth: Payer: Self-pay

## 2020-12-12 DIAGNOSIS — B20 Human immunodeficiency virus [HIV] disease: Secondary | ICD-10-CM

## 2020-12-12 MED ORDER — BIKTARVY 50-200-25 MG PO TABS: ORAL_TABLET | ORAL | 2 refills | Status: DC

## 2020-12-12 NOTE — Addendum Note (Signed)
Addended by: Linna Hoff D on: 12/12/2020 03:58 PM   Modules accepted: Orders

## 2020-12-12 NOTE — Telephone Encounter (Signed)
Refills sent, patient aware.   Sandie Ano, RN

## 2020-12-12 NOTE — Telephone Encounter (Signed)
Patient called needs medication refill for Samantha Norris, preferred Pharmacy [facility] on [address]. Patient contact number is [phone]

## 2020-12-13 ENCOUNTER — Other Ambulatory Visit: Payer: Self-pay

## 2020-12-13 ENCOUNTER — Telehealth: Payer: Self-pay

## 2020-12-13 ENCOUNTER — Ambulatory Visit: Payer: 59

## 2020-12-13 NOTE — Telephone Encounter (Signed)
[facility] Patient Advocate Encounter   Was successful in obtaining a [employer] copay card for Biktarvy.  This copay card will make the patients copay $0.00.  I have spoken with the patient.         Aaryn Parrilla, CPhT Specialty Pharmacy Patient Advocate [facility] Phone: [phone] Fax:  [phone]  

## 2020-12-18 ENCOUNTER — Ambulatory Visit: Payer: 59

## 2021-02-22 ENCOUNTER — Ambulatory Visit: Payer: 59 | Admitting: Infectious Diseases

## 2021-02-22 ENCOUNTER — Other Ambulatory Visit: Payer: Self-pay

## 2021-02-22 DIAGNOSIS — B20 Human immunodeficiency virus [HIV] disease: Secondary | ICD-10-CM

## 2021-02-22 MED ORDER — BIKTARVY 50-200-25 MG PO TABS: ORAL_TABLET | ORAL | 0 refills | Status: DC

## 2021-03-09 ENCOUNTER — Other Ambulatory Visit (HOSPITAL_COMMUNITY): Payer: Self-pay

## 2021-03-09 ENCOUNTER — Telehealth: Payer: Self-pay

## 2021-03-09 ENCOUNTER — Other Ambulatory Visit: Payer: Self-pay | Admitting: Pharmacist

## 2021-03-09 DIAGNOSIS — B20 Human immunodeficiency virus [HIV] disease: Secondary | ICD-10-CM

## 2021-03-09 MED ORDER — BICTEGRAVIR-EMTRICITAB-TENOFOV 50-200-25 MG PO TABS: 1.0000 | ORAL_TABLET | Freq: Every day | ORAL | 0 refills | Status: AC

## 2021-03-09 NOTE — Telephone Encounter (Signed)
Thank you :)

## 2021-03-09 NOTE — Telephone Encounter (Signed)
Patient called and says her "insurance" is expired. RN called [facility], they state patient's ICAP is expired and copay is over $5000. Olegario Messier to email the state as it looks like patient renewed in August 2022. Samples provided in the meantime.   Sandie Ano, RN

## 2021-03-09 NOTE — Progress Notes (Signed)
Medication Samples have been provided to the patient.  Drug name: Biktarvy        Strength: 50/200/25 mg       Qty: 14 tablets (2 bottles) LOT: [id number]   Exp.Date: 10/24  Dosing instructions: Take one tablet by mouth once daily  The patient has been instructed regarding the correct time, dose, and frequency of taking this medication, including desired effects and most common side effects.   Chayna Surratt, PharmD, CPP Clinical Pharmacist Practitioner Infectious Diseases Clinical Pharmacist [facility]  

## 2021-03-27 ENCOUNTER — Other Ambulatory Visit: Payer: 59

## 2021-03-27 ENCOUNTER — Other Ambulatory Visit: Payer: Self-pay

## 2021-03-27 DIAGNOSIS — B20 Human immunodeficiency virus [HIV] disease: Secondary | ICD-10-CM

## 2021-03-30 LAB — HIV RNA, RTPCR W/R GT (RTI, PI,INT)
HIV 1 RNA Quant: 64 copies/mL — ABNORMAL HIGH
HIV-1 RNA Quant, Log: 1.81 Log copies/mL — ABNORMAL HIGH

## 2021-04-10 ENCOUNTER — Other Ambulatory Visit: Payer: Self-pay

## 2021-04-10 ENCOUNTER — Ambulatory Visit (INDEPENDENT_AMBULATORY_CARE_PROVIDER_SITE_OTHER): Payer: 59 | Admitting: Infectious Diseases

## 2021-04-10 ENCOUNTER — Encounter: Payer: Self-pay | Admitting: Infectious Diseases

## 2021-04-10 VITALS — BP 116/88 | HR 69 | Temp 97.6°F | Wt 233.0 lb

## 2021-04-10 DIAGNOSIS — Z113 Encounter for screening for infections with a predominantly sexual mode of transmission: Secondary | ICD-10-CM | POA: Diagnosis not present

## 2021-04-10 DIAGNOSIS — Z79899 Other long term (current) drug therapy: Secondary | ICD-10-CM | POA: Diagnosis not present

## 2021-04-10 DIAGNOSIS — Z21 Asymptomatic human immunodeficiency virus [HIV] infection status: Secondary | ICD-10-CM

## 2021-04-10 DIAGNOSIS — R87610 Atypical squamous cells of undetermined significance on cytologic smear of cervix (ASC-US): Secondary | ICD-10-CM | POA: Diagnosis not present

## 2021-04-10 MED ORDER — BIKTARVY 50-200-25 MG PO TABS: ORAL_TABLET | ORAL | 11 refills | Status: DC

## 2021-04-10 NOTE — Progress Notes (Signed)
Name: Samantha Norris  DOB: 05/02/1970 MRN: [id number] PCP: Patient, No Pcp Per (Inactive)     Brief Narrative:  Samantha Norris is a 50 y.o. female with HIV disease. Dx 2013.  CD4 nadir < 100 HIV Risk: sexual History of OIs: none known Intake Labs : Hep B sAg (-), sAb (+), cAb (-); Hep A (+), Hep C (-) Quantiferon (-) HLA B*5701 (-) G6PD: (unknown)   Previous Regimens: Prezista + Norvir + Truvada (pregnancy) Kaletra BID + Truvada, 2013, suppressed  Biktarvy, suppressed    Genotypes: 2013 - wildtype   Subjective:   Chief Complaint  Patient presents with   Follow-up    B20     HPI: Here for routine follow up care. Doing well and no changes to her health. No new medications. She is considering skin removal surgery following weight loss and would like to discuss risks related to HIV condition.  Taking Biktarvy daily without any side effects. No concerns with obtaining medication.   Politely declined flu shot and covid booster today.   Has been using phentermine to lose weight - does not take it everyday d/t side effects of that but feels she has been pretty successful on her regimen.    Depression screen [facility] 2/9 04/10/2021  Decreased Interest 0  Down, Depressed, Hopeless 0  PHQ - 2 Score 0     Review of Systems  Constitutional:  Negative for appetite change, chills, fatigue, fever and unexpected weight change.  HENT:  Negative for mouth sores, sore throat and trouble swallowing.   Eyes:  Negative for pain and visual disturbance.  Respiratory:  Negative for cough and shortness of breath.   Cardiovascular:  Negative for chest pain.  Gastrointestinal:  Negative for abdominal pain, diarrhea and nausea.  Genitourinary:  Negative for dysuria, menstrual problem and pelvic pain.  Musculoskeletal:  Negative for back pain and neck pain.  Skin:  Negative for color change and rash.  Neurological:  Negative for weakness, numbness and headaches.  Hematological:  Negative  for adenopathy.  Psychiatric/Behavioral:  Negative for dysphoric mood. The patient is not nervous/anxious.      Past Medical History:  Diagnosis Date   Abdominal pain    Anemia    Blood transfusion    Constipation    HIV infection ([place])     Outpatient Medications Prior to Visit  Medication Sig Dispense Refill   Ferrous Sulfate (IRON SUPPLEMENT PO) Take by mouth daily.     valACYclovir (VALTREX) 1000 MG tablet Take 1 tablet (1,000 mg total) by mouth 2 (two) times daily. 60 tablet 1   bictegravir-emtricitabine-tenofovir AF (BIKTARVY) 50-200-25 MG TABS tablet TAKE 1 TABLET BY MOUTH DAILY AT THE SAME TIME EACH DAY WITH OR WITHOUT FOOD 30 tablet 0   doxycycline (VIBRAMYCIN) 100 MG capsule TAKE 1 CAPSULE BY MOUTH TWICE A DAY FOR 30 DAYS (Patient not taking: Reported on 04/10/2021)     metroNIDAZOLE (FLAGYL) 500 MG tablet Take 1 tablet (500 mg total) by mouth 2 (two) times daily. (Patient not taking: Reported on 04/10/2021) 14 tablet 1   No facility-administered medications prior to visit.     No Known Allergies  Social History   Tobacco Use   Smoking status: Never   Smokeless tobacco: Never  Substance Use Topics   Alcohol use: No   Drug use: No    Family History  Problem Relation Age of Onset   Heart disease Father        heart failure  Social History   Substance and Sexual Activity  Sexual Activity Not on file   Comment: declined condoms     Objective:   Vitals:   04/10/21 1008  BP: 116/88  Pulse: 69  Temp: 97.6 F (36.4 C)  TempSrc: Oral  Weight: 233 lb (105.7 kg)   Body mass index is 41.27 kg/m.  Physical Exam Constitutional:      Appearance: Normal appearance. She is not ill-appearing.  HENT:     Mouth/Throat:     Mouth: Mucous membranes are moist.     Pharynx: Oropharynx is clear.  Eyes:     General: No scleral icterus. Cardiovascular:     Rate and Rhythm: Normal rate.  Pulmonary:     Effort: Pulmonary effort is normal. No respiratory  distress.  Abdominal:     General: There is no distension.     Palpations: Abdomen is soft.  Skin:    General: Skin is warm and dry.  Neurological:     Mental Status: She is oriented to person, place, and time.  Psychiatric:        Mood and Affect: Mood normal.        Thought Content: Thought content normal.    Lab Results Lab Results  Component Value Date   WBC 5.1 10/12/2020   HGB 12.3 10/12/2020   HCT 39.0 10/12/2020   MCV 77.8 (L) 10/12/2020   PLT 78 (L) 10/12/2020    Lab Results  Component Value Date   CREATININE 1.17 (H) 10/12/2020   BUN 21 10/12/2020   NA 137 10/12/2020   K 3.8 10/12/2020   CL 100 10/12/2020   CO2 30 10/12/2020    Lab Results  Component Value Date   ALT 11 10/12/2020   AST 17 10/12/2020   ALKPHOS 55 07/22/2011   BILITOT 0.8 10/12/2020    Lab Results  Component Value Date   CHOL 256 (H) 10/12/2020   HDL 45 (L) 10/12/2020   LDLCALC 167 (H) 10/12/2020   TRIG 270 (H) 10/12/2020   CHOLHDL 5.7 (H) 10/12/2020   HIV 1 RNA Quant  Date Value  03/27/2021 64 copies/mL (H)  10/12/2020 <20 Copies/mL (H)  01/07/2019 <20 NOT DETECTED copies/mL   CD4 T Cell Abs (/uL)  Date Value  10/12/2020 306 (L)  01/07/2019 229 (L)  02/10/2018 110 (L)     Assessment & Plan:   Problem List Items Addressed This Visit       Unprioritized   HIV (human immunodeficiency virus infection) ([place]) - Primary (Chronic)    Doing well on Biktarvy - had a few weeks off medication d/t ADAP lapse but back on now. Will send in refills for her. She wants to come back in a month to repeat her VL to make sure it is back down to < 20 (was 64 recently). Explained that > 200 is a great response to treatment and we know she was off for a short time d/t lapse in ADAP. We reviewed this today and recommend she meet with financial in 23mat return lab visit to re-enroll to avoid this again.  Declined flu/covid vaccines.   Her immune system has improved to where her CD4 is > 300 now  and her VL's have been under much better control. I think her risk related to elective surgery as it pertains to HIV is very low. As long as she can continue to take her ARVs that is the largest reduction in her risk. I worry more about her BMI  of 41 than her well controlled HIV disease.   RTC in 58m She needs repeat pap smear at that time also.       Relevant Medications   bictegravir-emtricitabine-tenofovir AF (BIKTARVY) 50-200-25 MG TABS tablet   Other Relevant Orders   HIV-1 RNA quant-no reflex-bld   T-helper cell (CD4)- ([facility] clinic only)   CBC   Comprehensive metabolic panel   T-helper cell (CD4)- ([facility] clinic only)   HIV-1 RNA quant-no reflex-bld   ASCUS of cervix with negative high risk HPV    Needs repeat pap smear July 2022 to FU on ASCUS.       Other Visit Diagnoses     Routine screening for STI (sexually transmitted infection)       Relevant Orders   RPR   Polypharmacy           Total Encounter Time: 30 min, a majority of which spent in counsel over surgical risk r/t HIV, ADAP re-enrollment and lab results.    SJanene Madeira MSN, NP-C [facility] Infectious DMeridianPager: [phone]: [phone] 04/15/21  9:50 PM

## 2021-04-10 NOTE — Patient Instructions (Addendum)
Saxenda or Mounjaro   Please come back in 1 month for a visit with the lab and our financial team to renew your ADAP for the next period.   Return office visit in 1 year with me with labs 2 weeks prior to your appointment if you can.

## 2021-04-10 NOTE — Assessment & Plan Note (Addendum)
Doing well on Biktarvy - had a few weeks off medication d/t ADAP lapse but back on now. Will send in refills for her. She wants to come back in a month to repeat her VL to make sure it is back down to < 20 (was 64 recently). Explained that > 200 is a great response to treatment and we know she was off for a short time d/t lapse in ADAP. We reviewed this today and recommend she meet with financial in 33m at return lab visit to re-enroll to avoid this again.  Declined flu/covid vaccines.   Her immune system has improved to where her CD4 is > 300 now and her VL's have been under much better control. I think her risk related to elective surgery as it pertains to HIV is very low. As long as she can continue to take her ARVs that is the largest reduction in her risk. I worry more about her BMI of 41 than her well controlled HIV disease.   RTC in 68m. She needs repeat pap smear at that time also.

## 2021-04-15 NOTE — Assessment & Plan Note (Addendum)
Needs repeat pap smear July 2022 to FU on ASCUS.

## 2021-09-19 ENCOUNTER — Other Ambulatory Visit (HOSPITAL_COMMUNITY): Payer: Self-pay

## 2021-09-20 ENCOUNTER — Other Ambulatory Visit: Payer: Self-pay | Admitting: Pharmacist

## 2021-09-20 DIAGNOSIS — Z21 Asymptomatic human immunodeficiency virus [HIV] infection status: Secondary | ICD-10-CM

## 2021-09-20 MED ORDER — BIKTARVY 50-200-25 MG PO TABS: 1.0000 | ORAL_TABLET | Freq: Every day | ORAL | 0 refills | Status: AC

## 2021-09-20 NOTE — Progress Notes (Signed)
Medication Samples have been provided to the patient. ? ?Drug name: Biktarvy        ?Strength: 50/200/25 mg       ?Qty: 28 tablets (4 bottles)    ?LOT: [id number]   ?Exp.Date: 12/2023 ? ?Dosing instructions: Take one tablet by mouth once daily ? ?The patient has been instructed regarding the correct time, dose, and frequency of taking this medication, including desired effects and most common side effects.  ? ?Samantha Norris L. Aamna Mallozzi, PharmD, BCIDP, AAHIVP, CPP ?Clinical Pharmacist Practitioner ?Infectious Diseases Clinical Pharmacist ?[facility] ?04/10/2020, 10:07 AM ? ?

## 2022-04-14 ENCOUNTER — Other Ambulatory Visit: Payer: Self-pay | Admitting: Infectious Diseases

## 2022-04-14 DIAGNOSIS — Z21 Asymptomatic human immunodeficiency virus [HIV] infection status: Secondary | ICD-10-CM

## 2022-06-04 ENCOUNTER — Ambulatory Visit: Payer: Self-pay | Admitting: Infectious Diseases

## 2022-06-07 ENCOUNTER — Encounter: Payer: Self-pay | Admitting: Infectious Diseases

## 2022-06-07 ENCOUNTER — Ambulatory Visit (INDEPENDENT_AMBULATORY_CARE_PROVIDER_SITE_OTHER): Payer: No Typology Code available for payment source | Admitting: Infectious Diseases

## 2022-06-07 ENCOUNTER — Ambulatory Visit: Payer: Self-pay

## 2022-06-07 ENCOUNTER — Other Ambulatory Visit: Payer: Self-pay

## 2022-06-07 VITALS — BP 130/84 | HR 76 | Temp 97.7°F | Resp 16 | Ht 63.0 in | Wt 229.0 lb

## 2022-06-07 DIAGNOSIS — N8110 Cystocele, unspecified: Secondary | ICD-10-CM | POA: Diagnosis not present

## 2022-06-07 DIAGNOSIS — Z21 Asymptomatic human immunodeficiency virus [HIV] infection status: Secondary | ICD-10-CM | POA: Diagnosis not present

## 2022-06-07 DIAGNOSIS — R87610 Atypical squamous cells of undetermined significance on cytologic smear of cervix (ASC-US): Secondary | ICD-10-CM | POA: Diagnosis not present

## 2022-06-07 MED ORDER — BIKTARVY 50-200-25 MG PO TABS: ORAL_TABLET | ORAL | 11 refills | Status: DC

## 2022-06-07 NOTE — Assessment & Plan Note (Signed)
Grade 0 w/o signs of prolapse on exam. She has some intermittent incontinence but not significant to her life at this time. Could refer to pelvic floor PT if this bothered her.

## 2022-06-07 NOTE — Assessment & Plan Note (Signed)
Very well controlled on once daily Biktarvy. Had a lapse during grief moment with loss of life partner unexpectedly. No concerns with access or adherence to medication. They are tolerating the medication well without side effects. No drug interactions identified. Pertinent lab tests ordered today.  No changes to insurance coverage.  No dental needs today.  Working with counseling and has support she needs.  Sexual health and family planning discussed - pap smear collected. S/P menopause.  Vaccines deferred today.   Return in about 1 year (around 06/08/2023).

## 2022-06-07 NOTE — Assessment & Plan Note (Signed)
Normal pelvic exam. Normal bimanual exam.  Cervical brushing collected for cytology and HPV.  Discussed recommended screening interval for women living with HIV disease meant to be lifelong and at an interval of Q1-3 years pending results. Acceptable to space out Q3y with 3 consecutively normal exams. Further recommendations for Samantha Norris to follow today's results.  Results will be communicated to the patient via telephone call.

## 2022-06-07 NOTE — Progress Notes (Signed)
Name: Samantha Norris  DOB: 24-Mar-1971 MRN: [id number] PCP: Patient, No Pcp Per     Brief Narrative:  Samantha Norris is a 52 y.o. female with HIV disease. Dx 2013.  CD4 nadir < 100 HIV Risk: sexual History of OIs: none known Intake Labs : Hep B sAg (-), sAb (+), cAb (-); Hep A (+), Hep C (-) Quantiferon (-) HLA B*5701 (-) G6PD: (unknown)   Previous Regimens: Prezista + Norvir + Truvada (pregnancy) Kaletra BID + Truvada, 2013, suppressed  Biktarvy, suppressed    Genotypes: 2013 - wildtype     Subjective:   Chief Complaint  Patient presents with   Follow-up    B20    Gynecologic Exam      HPI: Here for routine follow up care. Lost her boyfriend over the summer suddenly and unexpectedly - understandably with a challenging time in grief afterward. She had a period of a few weeks where she did not take her biktarvy but states outside this moment she has done much better with adherence.  Only new medication has been tirzepitide for weight loss - currently on 5 mg weekly injections. Feels like it has a good effect on her appetite.   Pap smear - no history to her knowledge of abnormal paps in the past needing further intervention. Last was in 2022 ASCUS, HPV (-).       06/07/2022    9:15 AM  Depression screen PHQ 2/9  Decreased Interest 0  Down, Depressed, Hopeless 0  PHQ - 2 Score 0     Review of Systems  Constitutional:  Negative for appetite change, chills, fatigue, fever and unexpected weight change.  HENT:  Negative for mouth sores, sore throat and trouble swallowing.   Eyes:  Negative for pain and visual disturbance.  Respiratory:  Negative for cough and shortness of breath.   Cardiovascular:  Negative for chest pain.  Gastrointestinal:  Negative for abdominal pain, diarrhea and nausea.  Genitourinary:  Negative for dysuria, menstrual problem and pelvic pain.  Musculoskeletal:  Negative for back pain and neck pain.  Skin:  Negative for color change and  rash.  Neurological:  Negative for weakness, numbness and headaches.  Hematological:  Negative for adenopathy.  Psychiatric/Behavioral:  Negative for dysphoric mood. The patient is not nervous/anxious.       Past Medical History:  Diagnosis Date   Abdominal pain    Anemia    Blood transfusion    Constipation    HIV infection ([place])     Outpatient Medications Prior to Visit  Medication Sig Dispense Refill   ALPRAZolam (XANAX) 0.5 MG tablet Take by mouth.     cetirizine (ZYRTEC) 10 MG tablet Take 10 mg by mouth daily.     Ferrous Sulfate (IRON SUPPLEMENT PO) Take by mouth daily.     omeprazole (PRILOSEC) 40 MG capsule Take by mouth.     ondansetron (ZOFRAN-ODT) 4 MG disintegrating tablet PLEASE SEE ATTACHED FOR DETAILED DIRECTIONS     tirzepatide (MOUNJARO) 5 MG/0.5ML Pen Inject 5 mg into the skin once a week.     valACYclovir (VALTREX) 1000 MG tablet Take 1 tablet (1,000 mg total) by mouth 2 (two) times daily. 60 tablet 1   bictegravir-emtricitabine-tenofovir AF (BIKTARVY) 50-200-25 MG TABS tablet TAKE 1 TABLET BY MOUTH AT THE SAME TIME EVERY DAY WITH OR WITHOUT FOOD 30 tablet 0   No facility-administered medications prior to visit.     No Known Allergies  Social History   Tobacco Use  Smoking status: Never   Smokeless tobacco: Never  Substance Use Topics   Alcohol use: No   Drug use: No    Family History  Problem Relation Age of Onset   Heart disease Father        heart failure    Social History   Substance and Sexual Activity  Sexual Activity Not on file   Comment: declined condoms     Objective:   Vitals:   06/07/22 0912  BP: 130/84  Pulse: 76  Resp: 16  Temp: 97.7 F (36.5 C)  TempSrc: Oral  SpO2: 98%  Weight: 229 lb (103.9 kg)  Height: 5' 3"$  (1.6 m)   Body mass index is 40.57 kg/m.  Physical Exam Exam conducted with a chaperone present.  Constitutional:      Appearance: Normal appearance. She is not ill-appearing.  HENT:      Mouth/Throat:     Mouth: Mucous membranes are moist.     Pharynx: Oropharynx is clear.  Eyes:     General: No scleral icterus. Pulmonary:     Effort: Pulmonary effort is normal.  Genitourinary:    General: Normal vulva.     Pubic Area: No rash.      Vagina: Normal.     Cervix: Normal.     Comments: Grade 0 cystocele noted  Skin:    General: Skin is warm and dry.  Neurological:     Mental Status: She is oriented to person, place, and time.  Psychiatric:        Mood and Affect: Mood normal.        Thought Content: Thought content normal.     Lab Results Lab Results  Component Value Date   WBC 5.1 10/12/2020   HGB 12.3 10/12/2020   HCT 39.0 10/12/2020   MCV 77.8 (L) 10/12/2020   PLT 78 (L) 10/12/2020    Lab Results  Component Value Date   CREATININE 1.17 (H) 10/12/2020   BUN 21 10/12/2020   NA 137 10/12/2020   K 3.8 10/12/2020   CL 100 10/12/2020   CO2 30 10/12/2020    Lab Results  Component Value Date   ALT 11 10/12/2020   AST 17 10/12/2020   ALKPHOS 55 07/22/2011   BILITOT 0.8 10/12/2020    Lab Results  Component Value Date   CHOL 256 (H) 10/12/2020   HDL 45 (L) 10/12/2020   LDLCALC 167 (H) 10/12/2020   TRIG 270 (H) 10/12/2020   CHOLHDL 5.7 (H) 10/12/2020   HIV 1 RNA Quant  Date Value  03/27/2021 64 copies/mL (H)  10/12/2020 <20 Copies/mL (H)  01/07/2019 <20 NOT DETECTED copies/mL   CD4 T Cell Abs (/uL)  Date Value  10/12/2020 306 (L)  01/07/2019 229 (L)  02/10/2018 110 (L)     Assessment & Plan:   Problem List Items Addressed This Visit       Unprioritized   HIV (human immunodeficiency virus infection) ([place]) - Primary (Chronic)    Very well controlled on once daily Biktarvy. Had a lapse during grief moment with loss of life partner unexpectedly. No concerns with access or adherence to medication. They are tolerating the medication well without side effects. No drug interactions identified. Pertinent lab tests ordered today.  No changes to  insurance coverage.  No dental needs today.  Working with counseling and has support she needs.  Sexual health and family planning discussed - pap smear collected. S/P menopause.  Vaccines deferred today.   Return in about 1  year (around 06/08/2023).        Relevant Medications   bictegravir-emtricitabine-tenofovir AF (BIKTARVY) 50-200-25 MG TABS tablet   Other Relevant Orders   HIV 1 RNA quant-no reflex-bld   COMPLETE METABOLIC PANEL WITH GFR   CBC   T-helper cells (CD4) count (not at [facility])   ASCUS of cervix with negative high risk HPV    Normal pelvic exam. Normal bimanual exam.  Cervical brushing collected for cytology and HPV.  Discussed recommended screening interval for women living with HIV disease meant to be lifelong and at an interval of Q1-3 years pending results. Acceptable to space out Q3y with 3 consecutively normal exams. Further recommendations for TYNIAH HONTS to follow today's results.  Results will be communicated to the patient via telephone call.         Relevant Orders   Cytology - PAP( [place])   Cystocele, unspecified    Grade 0 w/o signs of prolapse on exam. She has some intermittent incontinence but not significant to her life at this time. Could refer to pelvic floor PT if this bothered her.        Janene Madeira, MSN, NP-C [facility] for [facility]ious [place] Pager: [phone] Office: [phone]  06/07/22  12:07 PM

## 2022-06-10 LAB — COMPLETE METABOLIC PANEL WITH GFR
AG Ratio: 1.3 (calc) (ref 1.0–2.5)
ALT: 12 U/L (ref 6–29)
AST: 22 U/L (ref 10–35)
Albumin: 4.4 g/dL (ref 3.6–5.1)
Alkaline phosphatase (APISO): 52 U/L (ref 37–153)
BUN: 15 mg/dL (ref 7–25)
CO2: 29 mmol/L (ref 20–32)
Calcium: 9.5 mg/dL (ref 8.6–10.4)
Chloride: 100 mmol/L (ref 98–110)
Creat: 1.03 mg/dL (ref 0.50–1.03)
Globulin: 3.3 g/dL (calc) (ref 1.9–3.7)
Glucose, Bld: 99 mg/dL (ref 65–99)
Potassium: 3.4 mmol/L — ABNORMAL LOW (ref 3.5–5.3)
Sodium: 140 mmol/L (ref 135–146)
Total Bilirubin: 0.6 mg/dL (ref 0.2–1.2)
Total Protein: 7.7 g/dL (ref 6.1–8.1)
eGFR: 66 mL/min/{1.73_m2} (ref >=60)

## 2022-06-10 LAB — CBC
HCT: 40.7 % (ref 35.0–45.0)
Hemoglobin: 13.2 g/dL (ref 11.7–15.5)
MCH: 25.6 pg — ABNORMAL LOW (ref 27.0–33.0)
MCHC: 32.4 g/dL (ref 32.0–36.0)
MCV: 78.9 fL — ABNORMAL LOW (ref 80.0–100.0)
MPV: 11.4 fL (ref 7.5–12.5)
Platelets: 153 10*3/uL (ref 140–400)
RBC: 5.16 10*6/uL — ABNORMAL HIGH (ref 3.80–5.10)
RDW: 13.9 % (ref 11.0–15.0)
WBC: 5.6 10*3/uL (ref 3.8–10.8)

## 2022-06-10 LAB — HIV-1 RNA QUANT-NO REFLEX-BLD
HIV 1 RNA Quant: NOT DETECTED Copies/mL
HIV-1 RNA Quant, Log: NOT DETECTED Log cps/mL

## 2022-06-10 LAB — T-HELPER CELLS (CD4) COUNT (NOT AT ARMC)
Absolute CD4: 292 cells/uL — ABNORMAL LOW (ref 490–1740)
CD4 T Helper %: 23 % — ABNORMAL LOW (ref 30–61)
Total lymphocyte count: 1278 cells/uL (ref 850–3900)

## 2022-06-12 ENCOUNTER — Telehealth: Payer: Self-pay

## 2022-06-12 LAB — CYTOLOGY - PAP
Adequacy: ABSENT
Comment: NEGATIVE
Diagnosis: NEGATIVE
Diagnosis: REACTIVE
High risk HPV: NEGATIVE

## 2022-06-12 NOTE — Telephone Encounter (Signed)
-----   Message from [place] Callas, NP sent at 06/12/2022  4:10 PM EST ----- Please call Samantha Norris to let her know her viral load is undetectable (great job with the medication), her immune system looks about the same as the last time we checked - her CD4 count is around 300.   Her pap smear test was normal - we can repeat this again in 3 years to follow up on any changes.   Please make sure she has a follow up with me in 1 year. She is VERY private about anything that comes to our clinic so just an FYI to whomever calls.

## 2022-06-12 NOTE — Telephone Encounter (Signed)
Called patient to relay results, no answer. Left HIPAA compliant voicemail requesting callback.   Beryle Flock, RN

## 2022-06-13 NOTE — Telephone Encounter (Signed)
Patient made aware of results and will call the office to set up 1 yr follow up visit.

## 2022-07-01 ENCOUNTER — Other Ambulatory Visit: Payer: Self-pay | Admitting: Infectious Diseases

## 2022-07-01 DIAGNOSIS — B009 Herpesviral infection, unspecified: Secondary | ICD-10-CM

## 2022-10-04 ENCOUNTER — Telehealth: Payer: Self-pay

## 2022-10-04 NOTE — Telephone Encounter (Signed)
Left message advising pt that insurance premium has not been paid and will need to be paid before ICAP will pick up medication copay.

## 2022-11-13 ENCOUNTER — Other Ambulatory Visit (HOSPITAL_COMMUNITY): Payer: Self-pay

## 2022-12-09 ENCOUNTER — Ambulatory Visit: Payer: No Typology Code available for payment source

## 2022-12-11 ENCOUNTER — Telehealth: Payer: Self-pay

## 2022-12-11 NOTE — Telephone Encounter (Signed)
I left a voicemail for Samantha Norris to return our call regarding rescheduling her ADAP appointment.

## 2022-12-26 ENCOUNTER — Other Ambulatory Visit (HOSPITAL_COMMUNITY): Payer: Self-pay

## 2023-09-08 NOTE — Progress Notes (Signed)
 The ASCVD Risk score (Arnett DK, et al., 2019) failed to calculate for the following reasons:   The systolic blood pressure is missing  Samantha Norris, BSN, Charity fundraiser

## 2023-10-02 ENCOUNTER — Telehealth: Payer: Self-pay

## 2023-10-02 NOTE — Telephone Encounter (Signed)
 Attempt to Re-Engage in Care  No office visit or HIV labs completed at [facility] within the last 12 months. Patient is considered out of care.   Last [facility] Visit: 06-07-22  Last HIV Viral Load:  HIV 1 RNA Quant  Date Value Ref Range Status  06/07/2022 Not Detected Copies/mL Final    Last CD4 Count:  CD4 T Cell Abs  Date Value Ref Range Status  10/12/2020 306 (L) 400 - 1,790 /uL Final    Medication Dispense History:   Dispensed Days Supply Quantity Provider Pharmacy  BIKTARVY  50/200/25MG  TABLETS 03/21/2023 30 30 each Orson Blalock, NP [facility]...  BIKTARVY  50/200/25MG  TABLETS 02/17/2023 30 30 each Orson Blalock, NP [facility] #...  BIKTARVY  50/200/25MG  TABLETS 01/16/2023 30 30 each Orson Blalock, NP [facility] #...    Interventions: Called [facility] to offer appointment, no answer. Left HIPAA compliant voicemail requesting callback.    Duration of Services: 5 minutes  [place] Howse, BSN, Charity fundraiser

## 2023-12-22 ENCOUNTER — Other Ambulatory Visit: Payer: Self-pay

## 2023-12-22 ENCOUNTER — Other Ambulatory Visit (HOSPITAL_COMMUNITY): Payer: Self-pay

## 2023-12-22 ENCOUNTER — Other Ambulatory Visit (HOSPITAL_COMMUNITY)
Admission: RE | Admit: 2023-12-22 | Discharge: 2023-12-22 | Disposition: A | Discharge status: Home / Self Care | Source: Ambulatory Visit | Attending: Infectious Diseases | Admitting: Infectious Diseases

## 2023-12-22 ENCOUNTER — Encounter: Payer: Self-pay | Admitting: Infectious Diseases

## 2023-12-22 ENCOUNTER — Ambulatory Visit: Admitting: Infectious Diseases

## 2023-12-22 VITALS — BP 112/77 | HR 80 | Temp 97.6°F | Ht 63.0 in | Wt 212.0 lb

## 2023-12-22 DIAGNOSIS — B3731 Acute candidiasis of vulva and vagina: Secondary | ICD-10-CM

## 2023-12-22 DIAGNOSIS — Z124 Encounter for screening for malignant neoplasm of cervix: Secondary | ICD-10-CM | POA: Insufficient documentation

## 2023-12-22 DIAGNOSIS — Z1231 Encounter for screening mammogram for malignant neoplasm of breast: Secondary | ICD-10-CM

## 2023-12-22 DIAGNOSIS — Z21 Asymptomatic human immunodeficiency virus [HIV] infection status: Secondary | ICD-10-CM

## 2023-12-22 DIAGNOSIS — Z1159 Encounter for screening for other viral diseases: Secondary | ICD-10-CM

## 2023-12-22 DIAGNOSIS — Z131 Encounter for screening for diabetes mellitus: Secondary | ICD-10-CM

## 2023-12-22 DIAGNOSIS — Z1211 Encounter for screening for malignant neoplasm of colon: Secondary | ICD-10-CM

## 2023-12-22 DIAGNOSIS — Z13220 Encounter for screening for lipoid disorders: Secondary | ICD-10-CM

## 2023-12-22 MED ORDER — FLUCONAZOLE 150 MG PO TABS: 150.0000 mg | ORAL_TABLET | ORAL | 0 refills | Status: AC

## 2023-12-22 MED ORDER — BIKTARVY 50-200-25 MG PO TABS: ORAL_TABLET | ORAL | 11 refills | Status: AC

## 2023-12-22 NOTE — Patient Instructions (Addendum)
 Will call you with all of your results once everything is back.   Refills for Biktarvy  and Diflucan  were sent in for you.   Boric Acid may be helpful for you  We can do the [facility] video option for your next visit if you like.

## 2023-12-22 NOTE — Progress Notes (Signed)
 Name: Samantha Norris  DOB: [birth year] MRN: [id number] PCP: Patient, No Pcp Per     Brief Narrative:  Samantha Norris is a 53 y.o. female with HIV disease. Dx 2013.  CD4 nadir < 100 HIV Risk: sexual History of OIs: none known Intake Labs : Hep B sAg (-), sAb (+), cAb (-); Hep A (+), Hep C (-) Quantiferon (-) HLA B*5701 (-) G6PD: (unknown)   Previous Regimens: Prezista + Norvir  + Truvada (pregnancy) Kaletra  BID + Truvada, 2013, suppressed  Biktarvy , suppressed    Genotypes: 2013 - wildtype     Subjective:   Chief Complaint  Patient presents with   Follow-up      Discussed the use of AI scribe software for clinical note transcription with the patient, who gave verbal consent to proceed.  History of Present Illness   Samantha Norris is a 53 year old female with HIV who presents for routine follow-up for HIV care.    Last OV 05/2022 with CD4 just below 300 and VL < 20 at that time. She has had some challenges with affording Biktarvy  with insurance changes. She has been working a lot of late and has put things on the back burner for her health wise.   She recently went to urgent care visit for chest pain. During that visit, she was found to have hypokalemia. She has a family history of colon cancer and prefers a colonoscopy over a Cologuard home test for screening. She is also due for a mammogram and a Pap smear, which are being arranged.  She recently underwent dental work and was prescribed antibiotics, resulting in a yeast infection. She is currently being treated with fluconazole  150 mg, with a repeat dose in three days if needed.  She is not interested in receiving any vaccines at this time. She has expressed privacy concerns regarding her medical information and prefers not to have paperwork with her medical details.           12/22/2023    9:23 AM  Depression screen PHQ 2/9  Decreased Interest 0  Down, Depressed, Hopeless 0  PHQ - 2 Score 0  Altered sleeping  0  Tired, decreased energy 0  Change in appetite 0  Feeling bad or failure about yourself  0  Trouble concentrating 0  Moving slowly or fidgety/restless 0  Suicidal thoughts 0  PHQ-9 Score 0  Difficult doing work/chores Not difficult at all      12/22/2023    9:24 AM  GAD 7 : Generalized Anxiety Score  Nervous, Anxious, on Edge 0  Control/stop worrying 0  Worry too much - different things 0  Trouble relaxing 0  Restless 0  Easily annoyed or irritable 0  Afraid - awful might happen 0  Total GAD 7 Score 0  Anxiety Difficulty Not difficult at all     Review of Systems  Constitutional:  Negative for appetite change, chills, fatigue, fever and unexpected weight change.  HENT:  Negative for mouth sores, sore throat and trouble swallowing.   Eyes:  Negative for pain and visual disturbance.  Respiratory:  Negative for cough and shortness of breath.   Cardiovascular:  Negative for chest pain.  Gastrointestinal:  Negative for abdominal pain, diarrhea and nausea.  Genitourinary:  Negative for dysuria, menstrual problem and pelvic pain.  Musculoskeletal:  Negative for back pain and neck pain.  Skin:  Negative for color change and rash.  Neurological:  Negative for weakness, numbness and headaches.  Hematological:  Negative for adenopathy.  Psychiatric/Behavioral:  Negative for dysphoric mood. The patient is not nervous/anxious.      Past Medical History:  Diagnosis Date   Abdominal pain    Anemia    Blood transfusion    Constipation    HIV infection (HCC)     Outpatient Medications Prior to Visit  Medication Sig Dispense Refill   ALPRAZolam (XANAX) 0.5 MG tablet Take by mouth.     cetirizine (ZYRTEC) 10 MG tablet Take 10 mg by mouth daily.     Ferrous Sulfate (IRON SUPPLEMENT PO) Take by mouth daily.     valACYclovir  (VALTREX ) 1000 MG tablet TAKE 1 TABLET(1000 MG) BY MOUTH TWICE DAILY 60 tablet 1   bictegravir-emtricitabine -tenofovir  AF (BIKTARVY ) 50-200-25 MG TABS tablet  TAKE 1 TABLET BY MOUTH AT THE SAME TIME EVERY DAY WITH OR WITHOUT FOOD 30 tablet 11   omeprazole (PRILOSEC) 40 MG capsule Take by mouth. (Patient not taking: Reported on 12/22/2023)     ondansetron (ZOFRAN-ODT) 4 MG disintegrating tablet PLEASE SEE ATTACHED FOR DETAILED DIRECTIONS (Patient not taking: Reported on 12/22/2023)     tirzepatide (MOUNJARO) 5 MG/0.5ML Pen Inject 5 mg into the skin once a week.     No facility-administered medications prior to visit.     No Known Allergies  Social History   Tobacco Use   Smoking status: Never   Smokeless tobacco: Never  Substance Use Topics   Alcohol use: No   Drug use: No    Social History   Substance and Sexual Activity  Sexual Activity Not on file   Comment: declined condoms     Objective:   Vitals:   12/22/23 0917  BP: 112/77  Pulse: 80  Temp: 97.6 F (36.4 C)  TempSrc: Temporal  SpO2: 97%  Weight: 212 lb (96.2 kg)  Height: 5' 3 (1.6 m)   Body mass index is 37.55 kg/m.  Physical Exam Exam conducted with a chaperone present.  Constitutional:      Appearance: Normal appearance. She is not ill-appearing.  HENT:     Mouth/Throat:     Mouth: Mucous membranes are moist.     Pharynx: Oropharynx is clear.  Eyes:     General: No scleral icterus. Pulmonary:     Effort: Pulmonary effort is normal.  Genitourinary:    General: Normal vulva.     Pubic Area: No rash.      Vagina: Normal.     Cervix: Normal.     Comments: Grade 0 cystocele noted  Skin:    General: Skin is warm and dry.  Neurological:     Mental Status: She is oriented to person, place, and time.  Psychiatric:        Mood and Affect: Mood normal.        Thought Content: Thought content normal.     Lab Results Lab Results  Component Value Date   WBC 5.6 06/07/2022   HGB 13.2 06/07/2022   HCT 40.7 06/07/2022   MCV 78.9 (L) 06/07/2022   PLT 153 06/07/2022    Lab Results  Component Value Date   CREATININE 1.03 06/07/2022   BUN 15 06/07/2022    NA 140 06/07/2022   K 3.4 (L) 06/07/2022   CL 100 06/07/2022   CO2 29 06/07/2022    Lab Results  Component Value Date   ALT 12 06/07/2022   AST 22 06/07/2022   ALKPHOS 55 07/22/2011   BILITOT 0.6 06/07/2022    Lab Results  Component Value Date  CHOL 256 (H) 10/12/2020   HDL 45 (L) 10/12/2020   LDLCALC 167 (H) 10/12/2020   TRIG 270 (H) 10/12/2020   CHOLHDL 5.7 (H) 10/12/2020   HIV 1 RNA Quant  Date Value  06/07/2022 Not Detected Copies/mL  03/27/2021 64 copies/mL (H)  10/12/2020 <20 Copies/mL (H)   CD4 T Cell Abs (/uL)  Date Value  10/12/2020 306 (L)  01/07/2019 229 (L)  02/10/2018 110 (L)    The ASCVD Risk score (Arnett DK, et al., 2019) failed to calculate for the following reasons:   Cannot find a previous HDL lab   Cannot find a previous total cholesterol lab     Assessment & Plan:   Assessment and Plan    HIV infection -  VL < 20, CD4 293 (Feb 2024) - Concerns about medication adherence and insurance coverage. Discussed the Reprieve trial showing a 35% reduction in cardiovascular events with pitavastatin in patients with good virologic control. Concerns about insurance coverage for pitavastatin due to cost at the moment and having trouble with primary ART Rx at the moment. Will have her meet with our financial team today before she leaves. 2 weeks of samples provided.  Pap smear completed today. Referral for mammogram (prefers [facility] in [place]) and colon cancer screening placed (Prefers [place]). She declined any recommended vaccines.  - Refill Biktarvy  prescription - Refer to [place] for [employer] supplemental assistance - Discuss [facility] and video visit options for follow-up - can try this at 83m follow u  Vulvovaginal candidiasis - Likely secondary to recent antibiotic use for dental work. Symptoms include a yeast infection. - Prescribe fluconazole  150 mg, two doses, with the second dose to be taken in three days  Routine cervical cancer screening Routine  cervical cancer screening is due. Asymptomatic trichomonas screening also considered. - Perform Pap smear - Order HPV genotype if Pap smear is positive - Perform cervical swab for asymptomatic trichomonas  Routine breast cancer screening - Routine breast cancer screening is due. - Refer for mammogram at [facility]  Routine colorectal cancer screening - Routine colorectal cancer screening is due. Family history of colorectal cancer noted. She prefers colonoscopy over Cologuard home test. - Refer to [facility] for colonoscopy  Lipid disorder screening - Lipid disorder screening is due. Importance of baseline lipid screening discussed, especially in the context of HIV and ART. Consideration of statin therapy based on risk assessment. - Order lipid panel  Diabetes mellitus screening Diabetes mellitus screening is due, especially important in the context of statin therapy and HIV management. - Screen for diabetes mellitus  Recording duration: 31 minutes         Meds ordered this encounter  Medications   fluconazole  (DIFLUCAN ) 150 MG tablet    Sig: Take 1 tablet (150 mg total) by mouth every 3 (three) days.    Dispense:  2 tablet    Refill:  0   bictegravir-emtricitabine -tenofovir  AF (BIKTARVY ) 50-200-25 MG TABS tablet    Sig: TAKE 1 TABLET BY MOUTH AT THE SAME TIME EVERY DAY WITH OR WITHOUT FOOD    Dispense:  30 tablet    Refill:  11   Orders Placed This Encounter  Procedures   MM 3D SCREENING MAMMOGRAM BILATERAL BREAST    Standing Status:   Future    Expiration Date:   12/21/2024    Scheduling Instructions:     [facility]     Address: [address], [place], [place] [address]  Phone: [phone]    Reason for Exam (SYMPTOM  OR DIAGNOSIS REQUIRED):   breast cancer screening    Is the patient pregnant?:   No    Preferred imaging location?:   External   HIV 1 RNA quant-no reflex-bld   T-helper cells (CD4) count   COMPLETE METABOLIC  PANEL WITHOUT GFR   CBC w/Diff   Measles/Mumps/Rubella Immunity   Lipid panel   Hemoglobin A1c   Ambulatory referral to Gastroenterology    Referral Priority:   Routine    Referral Type:   Consultation    Referral Reason:   Specialty Services Required    Number of Visits Requested:   1     Corean Fireman, MSN, NP-C [facility] Pager: [phone] Office: [phone]  12/22/23  11:26 AM

## 2023-12-23 LAB — CERVICOVAGINAL ANCILLARY ONLY
Bacterial Vaginitis (gardnerella): NEGATIVE
Candida Glabrata: NEGATIVE
Chlamydia: NEGATIVE
Comment: NEGATIVE
Comment: NEGATIVE
Comment: NEGATIVE
Comment: NEGATIVE
Comment: NORMAL
Neisseria Gonorrhea: NEGATIVE
Trichomonas: NEGATIVE

## 2023-12-24 LAB — CBC WITH DIFFERENTIAL/PLATELET
Absolute Lymphocytes: 1256 {cells}/uL (ref 850–3900)
Absolute Monocytes: 201 {cells}/uL (ref 200–950)
Basophils Absolute: 11 {cells}/uL (ref 0–200)
Basophils Relative: 0.2 %
Eosinophils Absolute: 127 {cells}/uL (ref 15–500)
Eosinophils Relative: 2.4 %
HCT: 40.7 % (ref 35.0–45.0)
Hemoglobin: 12.5 g/dL (ref 11.7–15.5)
MCH: 25 pg — ABNORMAL LOW (ref 27.0–33.0)
MCHC: 30.7 g/dL — ABNORMAL LOW (ref 32.0–36.0)
MCV: 81.4 fL (ref 80.0–100.0)
MPV: 11.8 fL (ref 7.5–12.5)
Monocytes Relative: 3.8 %
Neutro Abs: 3705 {cells}/uL (ref 1500–7800)
Neutrophils Relative %: 69.9 %
Platelets: 149 Thousand/uL (ref 140–400)
RBC: 5 Million/uL (ref 3.80–5.10)
RDW: 14 % (ref 11.0–15.0)
Total Lymphocyte: 23.7 %
WBC: 5.3 Thousand/uL (ref 3.8–10.8)

## 2023-12-24 LAB — COMPLETE METABOLIC PANEL WITHOUT GFR
AG Ratio: 1.5 (calc) (ref 1.0–2.5)
ALT: 13 U/L (ref 6–29)
AST: 18 U/L (ref 10–35)
Albumin: 4.1 g/dL (ref 3.6–5.1)
Alkaline phosphatase (APISO): 54 U/L (ref 37–153)
BUN/Creatinine Ratio: 10 (calc) (ref 6–22)
BUN: 10 mg/dL (ref 7–25)
CO2: 35 mmol/L — ABNORMAL HIGH (ref 20–32)
Calcium: 9.4 mg/dL (ref 8.6–10.4)
Chloride: 99 mmol/L (ref 98–110)
Creat: 1.04 mg/dL — ABNORMAL HIGH (ref 0.50–1.03)
Globulin: 2.7 g/dL (ref 1.9–3.7)
Glucose, Bld: 96 mg/dL (ref 65–99)
Potassium: 3.7 mmol/L (ref 3.5–5.3)
Sodium: 138 mmol/L (ref 135–146)
Total Bilirubin: 0.7 mg/dL (ref 0.2–1.2)
Total Protein: 6.8 g/dL (ref 6.1–8.1)

## 2023-12-24 LAB — LIPID PANEL
Cholesterol: 230 mg/dL — ABNORMAL HIGH (ref <200)
HDL: 51 mg/dL (ref >=50)
LDL Cholesterol (Calc): 140 mg/dL — ABNORMAL HIGH
Non-HDL Cholesterol (Calc): 179 mg/dL — ABNORMAL HIGH (ref <130)
Total CHOL/HDL Ratio: 4.5 (calc) (ref <5.0)
Triglycerides: 251 mg/dL — ABNORMAL HIGH (ref <150)

## 2023-12-24 LAB — HEMOGLOBIN A1C
Hgb A1c MFr Bld: 5.5 % (ref <5.7)
Mean Plasma Glucose: 111 mg/dL
eAG (mmol/L): 6.2 mmol/L

## 2023-12-24 LAB — HIV-1 RNA QUANT-NO REFLEX-BLD
HIV 1 RNA Quant: 34 {copies}/mL — ABNORMAL HIGH
HIV-1 RNA Quant, Log: 1.53 {Log_copies}/mL — ABNORMAL HIGH

## 2023-12-24 LAB — MEASLES/MUMPS/RUBELLA IMMUNITY
Mumps IgG: <9 [AU]/ml — ABNORMAL LOW
Rubella: 2.1 {index}
Rubeola IgG: 234 [AU]/ml

## 2023-12-24 LAB — T-HELPER CELLS (CD4) COUNT (NOT AT ARMC)
CD4 % Helper T Cell: 27 % — ABNORMAL LOW (ref 33–65)
CD4 T Cell Abs: 318 /uL — ABNORMAL LOW (ref 400–1790)

## 2023-12-25 ENCOUNTER — Other Ambulatory Visit: Payer: Self-pay | Admitting: Pharmacist

## 2023-12-25 DIAGNOSIS — Z21 Asymptomatic human immunodeficiency virus [HIV] infection status: Secondary | ICD-10-CM

## 2023-12-25 MED ORDER — BICTEGRAVIR-EMTRICITAB-TENOFOV 50-200-25 MG PO TABS: 1.0000 | ORAL_TABLET | Freq: Every day | ORAL | Status: AC

## 2023-12-25 NOTE — Progress Notes (Signed)
 Medication Samples have been provided to the patient.  Drug name: Biktarvy         Strength: 50/200/25 mg       Qty: 14 tablets (2 bottles) LOT: CTGMDA   Exp.Date: 7/27  Samples requested by Gibson Kurtz, NP.  Dosing instructions: Take one tablet by mouth once daily  The patient has been instructed regarding the correct time, dose, and frequency of taking this medication, including desired effects and most common side effects.   Nicklas Barns, PharmD, CPP, BCIDP, AAHIVP Clinical Pharmacist Practitioner Infectious Diseases Clinical Pharmacist [facility]

## 2023-12-26 LAB — CYTOLOGY - PAP
Adequacy: ABSENT
Diagnosis: NEGATIVE
Diagnosis: REACTIVE

## 2024-01-05 ENCOUNTER — Ambulatory Visit: Payer: Self-pay | Admitting: Infectious Diseases

## 2024-01-05 NOTE — Telephone Encounter (Signed)
 Patient aware of results. Will call back regarding referral to GI, she is changing insurances.

## 2024-01-05 NOTE — Telephone Encounter (Signed)
-----   Message from Secretary sent at 01/05/2024  3:27 PM EDT ----- Please call Keishia to let her know:   Her pap smear results were normal - I would recommend we repeat one more in 1 year before we go to every 3 years.   Diabetes screening tst was negative She is IMMUNE to measles - no booster vaccine needed  Liver and kidney function are great  Blood sugar and other electrolytes normal.  CD4 count has really done great to recover and up to 318 now with her viral load undetectable.   Her referral was declined to [facility] due to them not taking her insurance. I can try to refer her to [place] if she would like or if she wants to investigate with her insurance somewhere more local  we can put referral to wherever she prefers for colonoscopy.   ~Stephanie ----- Message ----- From: Interface, [facility] Results In Sent: 12/22/2023  11:01 PM EDT To: Corean LOISE Fireman, NP

## 2024-03-23 NOTE — Progress Notes (Signed)
 The 10-year ASCVD risk score (Arnett DK, et al., 2019) is: 1.8%   Values used to calculate the score:     Age: 53 years     Clincally relevant sex: Female     Is Non-Hispanic African American: Yes     Diabetic: No     Tobacco smoker: No     Systolic Blood Pressure: 112 mmHg     Is BP treated: No     HDL Cholesterol: 51 mg/dL     Total Cholesterol: 230 mg/dL  ASCVD < 5%  Duwaine Lowe, BSN, [facility]DRAISER

## 2024-04-05 ENCOUNTER — Other Ambulatory Visit (HOSPITAL_COMMUNITY): Payer: Self-pay
